# Patient Record
Sex: Female | Born: 2000 | Race: White | Hispanic: No | Marital: Single | Smoking: Never smoker
Health system: Southern US, Community
[De-identification: ages and names within clinical notes are randomized; demographics above are authoritative.]

## PROBLEM LIST (undated history)

## (undated) DIAGNOSIS — J45909 Unspecified asthma, uncomplicated: Secondary | ICD-10-CM

## (undated) DIAGNOSIS — Z91010 Allergy to peanuts: Secondary | ICD-10-CM

## (undated) DIAGNOSIS — F419 Anxiety disorder, unspecified: Secondary | ICD-10-CM

## (undated) DIAGNOSIS — F32A Depression, unspecified: Secondary | ICD-10-CM

## (undated) DIAGNOSIS — D649 Anemia, unspecified: Secondary | ICD-10-CM

## (undated) HISTORY — DX: Unspecified asthma, uncomplicated: J45.909

## (undated) HISTORY — DX: Allergy to peanuts: Z91.010

## (undated) HISTORY — DX: Anxiety disorder, unspecified: F41.9

## (undated) HISTORY — PX: WISDOM TOOTH EXTRACTION: SHX21

## (undated) HISTORY — DX: Anemia, unspecified: D64.9

## (undated) HISTORY — PX: NO PAST SURGERIES: SHX2092

## (undated) HISTORY — DX: Depression, unspecified: F32.A

---
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
## 2021-01-29 ENCOUNTER — Other Ambulatory Visit: Payer: Self-pay | Admitting: Physician Assistant

## 2021-01-29 DIAGNOSIS — Z3041 Encounter for surveillance of contraceptive pills: Secondary | ICD-10-CM

## 2021-01-31 NOTE — Telephone Encounter (Signed)
Please reach out to patient to schedule CPE per last AVS.

## 2021-02-08 ENCOUNTER — Encounter: Payer: Self-pay | Admitting: Surgical

## 2021-02-08 ENCOUNTER — Other Ambulatory Visit: Payer: Self-pay

## 2021-02-08 ENCOUNTER — Ambulatory Visit (INDEPENDENT_AMBULATORY_CARE_PROVIDER_SITE_OTHER): Payer: BC Managed Care – PPO | Admitting: Surgical

## 2021-02-08 VITALS — BP 117/80 | HR 80 | Ht 63.0 in | Wt 198.8 lb

## 2021-02-08 DIAGNOSIS — M546 Pain in thoracic spine: Secondary | ICD-10-CM

## 2021-02-08 DIAGNOSIS — M25512 Pain in left shoulder: Secondary | ICD-10-CM

## 2021-02-08 DIAGNOSIS — F603 Borderline personality disorder: Secondary | ICD-10-CM

## 2021-02-08 DIAGNOSIS — M25511 Pain in right shoulder: Secondary | ICD-10-CM

## 2021-02-08 DIAGNOSIS — G8929 Other chronic pain: Secondary | ICD-10-CM

## 2021-02-08 DIAGNOSIS — N62 Hypertrophy of breast: Secondary | ICD-10-CM

## 2021-02-08 MED ORDER — ONDANSETRON HCL 4 MG PO TABS: 4.0000 mg | ORAL_TABLET | Freq: Three times a day (TID) | ORAL | 0 refills | Status: DC | PRN

## 2021-02-08 MED ORDER — HYDROCODONE-ACETAMINOPHEN 5-325 MG PO TABS: 1.0000 | ORAL_TABLET | Freq: Four times a day (QID) | ORAL | 0 refills | Status: AC | PRN

## 2021-02-08 MED ORDER — CEPHALEXIN 500 MG PO CAPS: 500.0000 mg | ORAL_CAPSULE | Freq: Four times a day (QID) | ORAL | 0 refills | Status: AC

## 2021-02-08 NOTE — Progress Notes (Signed)
Patient ID: Anna Pineda, female    DOB: 03/06/2001, 20 y.o.   MRN: [id number]  Chief Complaint  Patient presents with   Pre-op Exam       ICD-10-CM   1. Borderline personality disorder (HCC)  F60.3     2. Symptomatic mammary hypertrophy  N62     3. Chronic bilateral thoracic back pain  M54.6    G89.29     4. Chronic pain of both shoulders  M25.511    G89.29    M25.512        History of Present Illness: Anna Pineda is a 20 y.o.  female  with a history of macromastia.  She presents for preoperative evaluation for upcoming procedure, Bilateral Breast Reduction, scheduled for 02/24/2021 with Dr.  Ulice Bold  The patient has not had problems with anesthesia. No history of DVT/PE.  No family history of DVT/PE.  No family or personal history of bleeding or clotting disorders.  Patient is not currently taking any blood thinners.  No history of CVA/MI.   Summary of Previous Visit: Patient would like to be a C/D cup.  STN on the right is 32 cm and the left is 32 cm.  Estimated excess breast tissue to be removed at time of surgery: 550 on the left and 550 on the right.  PMH Significant for: Anxiety, depression, mild asthma   Past Medical History: Allergies: Allergies  Allergen Reactions   Peanut-Containing Drug Products Hives   Latex Rash    Current Medications:  Current Outpatient Medications:    cetirizine (ZYRTEC ALLERGY) 10 MG tablet, Take 1 tablet (10 mg total) by mouth daily., Disp: 30 tablet, Rfl: 0   hydrOXYzine (ATARAX/VISTARIL) 25 MG tablet, Take 1 tablet (25 mg total) by mouth at bedtime as needed (for sleeping difficulties.)., Disp: 30 tablet, Rfl: 1   ibuprofen (ADVIL,MOTRIN) 200 MG tablet, Take 200 mg by mouth every 6 (six) hours as needed for headache. , Disp: , Rfl:    montelukast (SINGULAIR) 10 MG tablet, Take 10 mg by mouth at bedtime., Disp: , Rfl:    naproxen (NAPROSYN) 375 MG tablet, Take 1 tablet (375 mg total) by mouth 2 (two) times daily with a  meal., Disp: 30 tablet, Rfl: 0   pseudoephedrine (SUDAFED) 30 MG tablet, Take 1 tablet (30 mg total) by mouth every 8 (eight) hours as needed for congestion., Disp: 30 tablet, Rfl: 0   SPRINTEC 28 0.25-35 MG-MCG tablet, TAKE 1 TABLET BY MOUTH EVERY DAY, Disp: 28 tablet, Rfl: 2   EPINEPHrine 0.3 mg/0.3 mL IJ SOAJ injection, , Disp: , Rfl:    fluticasone (FLONASE) 50 MCG/ACT nasal spray, Place 2 sprays into both nostrils daily., Disp: 16 g, Rfl: 0   PROAIR HFA 108 (90 BASE) MCG/ACT inhaler, Inhale 1 puff into the lungs as needed. (Patient not taking: Reported on 02/08/2021), Disp: , Rfl: 12  Past Medical Problems: Past Medical History:  Diagnosis Date   Anemia    Phreesia 02/04/2020   Anxiety    Phreesia 02/04/2020   Asthma    Phreesia 02/04/2020   Depression    Phreesia 02/04/2020   Depression, recurrent (HCC) 07/08/2019   Food allergy, peanut    GAD (generalized anxiety disorder) 07/08/2019   Mild asthma     Past Surgical History: Past Surgical History:  Procedure Laterality Date   NO PAST SURGERIES      Social History: Social History   Socioeconomic History   Marital status: Single  Spouse name: Not on file   Number of children: Not on file   Years of education: Not on file   Highest education level: Not on file  Occupational History   Occupation: waitress    Employer: [employer]  Tobacco Use   Smoking status: Passive Smoke Exposure - Never Smoker   Smokeless tobacco: Never  Substance and Sexual Activity   Alcohol use: Not Currently   Drug use: Yes    Types: Marijuana   Sexual activity: Yes    Birth control/protection: Pill  Other Topics Concern   Not on file  Social History Narrative   Not on file   Social Determinants of Health   Financial Resource Strain: Not on file  Food Insecurity: Not on file  Transportation Needs: Not on file  Physical Activity: Not on file  Stress: Not on file  Social Connections: Not on file  Intimate Partner Violence: Not  on file    Family History: Family History  Problem Relation Age of Onset   Hypertension Mother    Breast cancer Mother 62   Depression Father    Diabetes Father    Hyperlipidemia Father    Hypertension Father     Review of Systems: Review of Systems  Constitutional: Negative.   Respiratory: Negative.    Cardiovascular: Negative.   Gastrointestinal: Negative.   Neurological: Negative.    Physical Exam: Vital Signs BP 117/80 (BP Location: Right Arm, Patient Position: Sitting, Cuff Size: Large)   Pulse 80   Ht 5\' 3"  (1.6 m)   Wt 198 lb 12.8 oz (90.2 kg)   SpO2 98%   BMI 35.22 kg/m   Physical Exam  Constitutional:      General: Not in acute distress.    Appearance: Normal appearance. Not ill-appearing.  HENT:     Head: Normocephalic and atraumatic.  Eyes:     Pupils: Pupils are equal, round Neck:     Musculoskeletal: Normal range of motion.  Cardiovascular:     Rate and Rhythm: Normal rate    Pulses: Normal pulses.  Pulmonary:     Effort: Pulmonary effort is normal. No respiratory distress.  Abdominal:     General: Abdomen is flat. There is no distension.  Musculoskeletal: Normal range of motion.  Skin:    General: Skin is warm and dry.     Findings: No erythema or rash.  Neurological:     General: No focal deficit present.     Mental Status: Alert and oriented to person, place, and time. Mental status is at baseline.     Motor: No weakness.  Psychiatric:        Mood and Affect: Mood normal.        Behavior: Behavior normal.    Assessment/Plan: The patient is scheduled for bilateral breast reduction with Dr. .  Risks, benefits, and alternatives of procedure discussed, questions answered and consent obtained.    Smoking Status: Quit smoking 2 months ago; Counseling Given?  Yes Last Mammogram: No mammographic history  Caprini Score: 4, moderate; Risk Factors include: Currently on OCPs, BMI greater than 25, and length of planned surgery.  Recommendation for mechanical and pharmacological prophylaxis while hospitalized. Encourage early ambulation.   Pictures obtained:[email or web address]   Post-op Rx sent to pharmacy: Norco, Zofran, Keflex  Patient was provided with the breast reduction and General Surgical Risk consent document and Pain Medication Agreement prior to their appointment.  They had adequate time to read through the risk consent documents and Pain Medication Agreement. We  also discussed them in person together during this preop appointment. All of their questions were answered to their satisfaction.  Recommended calling if they have any further questions.  Risk consent form and Pain Medication Agreement to be scanned into patient's chart.  The risk that can be encountered with breast reduction were discussed and include the following but not limited to these:  Breast asymmetry, fluid accumulation, firmness of the breast, inability to breast feed, loss of nipple or areola, skin loss, decrease or no nipple sensation, fat necrosis of the breast tissue, bleeding, infection, healing delay.  There are risks of anesthesia, changes to skin sensation and injury to nerves or blood vessels.  The muscle can be temporarily or permanently injured.  You may have an allergic reaction to tape, suture, glue, blood products which can result in skin discoloration, swelling, pain, skin lesions, poor healing.  Any of these can lead to the need for revisonal surgery or stage procedures.  A reduction has potential to interfere with diagnostic procedures.  Nipple or breast piercing can increase risks of infection.  This procedure is best done when the breast is fully developed.  Changes in the breast will continue to occur over time.  Pregnancy can alter the outcomes of previous breast reduction surgery, weight gain and weigh loss can also effect the long term appearance.   Patient is aware she will need to remove nipple piercings prior to surgery to decrease risk  of postoperative complications including infection.  Electronically signed by: Kermit Balo Camauri Craton, PA-C 02/08/2021 9:53 AM

## 2021-02-08 NOTE — H&P (View-Only) (Signed)
Patient ID: Anna Pineda, female    DOB: 03/06/2001, 20 y.o.   MRN: [id number]  Chief Complaint  Patient presents with   Pre-op Exam       ICD-10-CM   1. Borderline personality disorder (HCC)  F60.3     2. Symptomatic mammary hypertrophy  N62     3. Chronic bilateral thoracic back pain  M54.6    G89.29     4. Chronic pain of both shoulders  M25.511    G89.29    M25.512        History of Present Illness: Anna Pineda is a 20 y.o.  female  with a history of macromastia.  She presents for preoperative evaluation for upcoming procedure, Bilateral Breast Reduction, scheduled for 02/24/2021 with Dr.  Ulice Bold  The patient has not had problems with anesthesia. No history of DVT/PE.  No family history of DVT/PE.  No family or personal history of bleeding or clotting disorders.  Patient is not currently taking any blood thinners.  No history of CVA/MI.   Summary of Previous Visit: Patient would like to be a C/D cup.  STN on the right is 32 cm and the left is 32 cm.  Estimated excess breast tissue to be removed at time of surgery: 550 on the left and 550 on the right.  PMH Significant for: Anxiety, depression, mild asthma   Past Medical History: Allergies: Allergies  Allergen Reactions   Peanut-Containing Drug Products Hives   Latex Rash    Current Medications:  Current Outpatient Medications:    cetirizine (ZYRTEC ALLERGY) 10 MG tablet, Take 1 tablet (10 mg total) by mouth daily., Disp: 30 tablet, Rfl: 0   hydrOXYzine (ATARAX/VISTARIL) 25 MG tablet, Take 1 tablet (25 mg total) by mouth at bedtime as needed (for sleeping difficulties.)., Disp: 30 tablet, Rfl: 1   ibuprofen (ADVIL,MOTRIN) 200 MG tablet, Take 200 mg by mouth every 6 (six) hours as needed for headache. , Disp: , Rfl:    montelukast (SINGULAIR) 10 MG tablet, Take 10 mg by mouth at bedtime., Disp: , Rfl:    naproxen (NAPROSYN) 375 MG tablet, Take 1 tablet (375 mg total) by mouth 2 (two) times daily with a  meal., Disp: 30 tablet, Rfl: 0   pseudoephedrine (SUDAFED) 30 MG tablet, Take 1 tablet (30 mg total) by mouth every 8 (eight) hours as needed for congestion., Disp: 30 tablet, Rfl: 0   SPRINTEC 28 0.25-35 MG-MCG tablet, TAKE 1 TABLET BY MOUTH EVERY DAY, Disp: 28 tablet, Rfl: 2   EPINEPHrine 0.3 mg/0.3 mL IJ SOAJ injection, , Disp: , Rfl:    fluticasone (FLONASE) 50 MCG/ACT nasal spray, Place 2 sprays into both nostrils daily., Disp: 16 g, Rfl: 0   PROAIR HFA 108 (90 BASE) MCG/ACT inhaler, Inhale 1 puff into the lungs as needed. (Patient not taking: Reported on 02/08/2021), Disp: , Rfl: 12  Past Medical Problems: Past Medical History:  Diagnosis Date   Anemia    Phreesia 02/04/2020   Anxiety    Phreesia 02/04/2020   Asthma    Phreesia 02/04/2020   Depression    Phreesia 02/04/2020   Depression, recurrent (HCC) 07/08/2019   Food allergy, peanut    GAD (generalized anxiety disorder) 07/08/2019   Mild asthma     Past Surgical History: Past Surgical History:  Procedure Laterality Date   NO PAST SURGERIES      Social History: Social History   Socioeconomic History   Marital status: Single  Spouse name: Not on file   Number of children: Not on file   Years of education: Not on file   Highest education level: Not on file  Occupational History   Occupation: waitress    Employer: [employer]  Tobacco Use   Smoking status: Passive Smoke Exposure - Never Smoker   Smokeless tobacco: Never  Substance and Sexual Activity   Alcohol use: Not Currently   Drug use: Yes    Types: Marijuana   Sexual activity: Yes    Birth control/protection: Pill  Other Topics Concern   Not on file  Social History Narrative   Not on file   Social Determinants of Health   Financial Resource Strain: Not on file  Food Insecurity: Not on file  Transportation Needs: Not on file  Physical Activity: Not on file  Stress: Not on file  Social Connections: Not on file  Intimate Partner Violence: Not  on file    Family History: Family History  Problem Relation Age of Onset   Hypertension Mother    Breast cancer Mother 62   Depression Father    Diabetes Father    Hyperlipidemia Father    Hypertension Father     Review of Systems: Review of Systems  Constitutional: Negative.   Respiratory: Negative.    Cardiovascular: Negative.   Gastrointestinal: Negative.   Neurological: Negative.    Physical Exam: Vital Signs BP 117/80 (BP Location: Right Arm, Patient Position: Sitting, Cuff Size: Large)   Pulse 80   Ht 5\' 3"  (1.6 m)   Wt 198 lb 12.8 oz (90.2 kg)   SpO2 98%   BMI 35.22 kg/m   Physical Exam  Constitutional:      General: Not in acute distress.    Appearance: Normal appearance. Not ill-appearing.  HENT:     Head: Normocephalic and atraumatic.  Eyes:     Pupils: Pupils are equal, round Neck:     Musculoskeletal: Normal range of motion.  Cardiovascular:     Rate and Rhythm: Normal rate    Pulses: Normal pulses.  Pulmonary:     Effort: Pulmonary effort is normal. No respiratory distress.  Abdominal:     General: Abdomen is flat. There is no distension.  Musculoskeletal: Normal range of motion.  Skin:    General: Skin is warm and dry.     Findings: No erythema or rash.  Neurological:     General: No focal deficit present.     Mental Status: Alert and oriented to person, place, and time. Mental status is at baseline.     Motor: No weakness.  Psychiatric:        Mood and Affect: Mood normal.        Behavior: Behavior normal.    Assessment/Plan: The patient is scheduled for bilateral breast reduction with Dr. .  Risks, benefits, and alternatives of procedure discussed, questions answered and consent obtained.    Smoking Status: Quit smoking 2 months ago; Counseling Given?  Yes Last Mammogram: No mammographic history  Caprini Score: 4, moderate; Risk Factors include: Currently on OCPs, BMI greater than 25, and length of planned surgery.  Recommendation for mechanical and pharmacological prophylaxis while hospitalized. Encourage early ambulation.   Pictures obtained:@[email or web address]   Post-op Rx sent to pharmacy: Norco, Zofran, Keflex  Patient was provided with the breast reduction and General Surgical Risk consent document and Pain Medication Agreement prior to their appointment.  They had adequate time to read through the risk consent documents and Pain Medication Agreement. We  also discussed them in person together during this preop appointment. All of their questions were answered to their satisfaction.  Recommended calling if they have any further questions.  Risk consent form and Pain Medication Agreement to be scanned into patient's chart.  The risk that can be encountered with breast reduction were discussed and include the following but not limited to these:  Breast asymmetry, fluid accumulation, firmness of the breast, inability to breast feed, loss of nipple or areola, skin loss, decrease or no nipple sensation, fat necrosis of the breast tissue, bleeding, infection, healing delay.  There are risks of anesthesia, changes to skin sensation and injury to nerves or blood vessels.  The muscle can be temporarily or permanently injured.  You may have an allergic reaction to tape, suture, glue, blood products which can result in skin discoloration, swelling, pain, skin lesions, poor healing.  Any of these can lead to the need for revisonal surgery or stage procedures.  A reduction has potential to interfere with diagnostic procedures.  Nipple or breast piercing can increase risks of infection.  This procedure is best done when the breast is fully developed.  Changes in the breast will continue to occur over time.  Pregnancy can alter the outcomes of previous breast reduction surgery, weight gain and weigh loss can also effect the long term appearance.   Patient is aware she will need to remove nipple piercings prior to surgery to decrease risk  of postoperative complications including infection.  Electronically signed by: Kermit Balo Britanie Harshman, PA-C 02/08/2021 9:53 AM

## 2021-02-17 ENCOUNTER — Encounter (HOSPITAL_BASED_OUTPATIENT_CLINIC_OR_DEPARTMENT_OTHER): Payer: Self-pay | Admitting: Plastic Surgery

## 2021-02-17 ENCOUNTER — Other Ambulatory Visit: Payer: Self-pay

## 2021-02-24 ENCOUNTER — Ambulatory Visit (HOSPITAL_BASED_OUTPATIENT_CLINIC_OR_DEPARTMENT_OTHER): Payer: BC Managed Care – PPO | Admitting: Anesthesiology

## 2021-02-24 ENCOUNTER — Ambulatory Visit (HOSPITAL_BASED_OUTPATIENT_CLINIC_OR_DEPARTMENT_OTHER)
Admission: RE | Admit: 2021-02-24 | Discharge: 2021-02-24 | Disposition: A | Discharge status: Home / Self Care | Payer: BC Managed Care – PPO | Attending: Plastic Surgery | Admitting: Plastic Surgery

## 2021-02-24 ENCOUNTER — Encounter (HOSPITAL_BASED_OUTPATIENT_CLINIC_OR_DEPARTMENT_OTHER): Payer: Self-pay | Admitting: Plastic Surgery

## 2021-02-24 ENCOUNTER — Other Ambulatory Visit: Payer: Self-pay

## 2021-02-24 ENCOUNTER — Encounter (HOSPITAL_BASED_OUTPATIENT_CLINIC_OR_DEPARTMENT_OTHER)
Admission: RE | Disposition: A | Discharge status: Home / Self Care | Payer: Self-pay | Source: Home / Self Care | Attending: Plastic Surgery

## 2021-02-24 DIAGNOSIS — N6489 Other specified disorders of breast: Secondary | ICD-10-CM | POA: Insufficient documentation

## 2021-02-24 DIAGNOSIS — Z91010 Allergy to peanuts: Secondary | ICD-10-CM | POA: Insufficient documentation

## 2021-02-24 DIAGNOSIS — G8929 Other chronic pain: Secondary | ICD-10-CM | POA: Diagnosis not present

## 2021-02-24 DIAGNOSIS — N62 Hypertrophy of breast: Secondary | ICD-10-CM | POA: Insufficient documentation

## 2021-02-24 DIAGNOSIS — M542 Cervicalgia: Secondary | ICD-10-CM | POA: Diagnosis not present

## 2021-02-24 DIAGNOSIS — Z79899 Other long term (current) drug therapy: Secondary | ICD-10-CM | POA: Insufficient documentation

## 2021-02-24 DIAGNOSIS — M546 Pain in thoracic spine: Secondary | ICD-10-CM | POA: Diagnosis not present

## 2021-02-24 DIAGNOSIS — Z91040 Latex allergy status: Secondary | ICD-10-CM | POA: Insufficient documentation

## 2021-02-24 HISTORY — PX: BREAST REDUCTION SURGERY: SHX8

## 2021-02-24 LAB — POCT PREGNANCY, URINE: Preg Test, Ur: NEGATIVE

## 2021-02-24 SURGERY — BREAST REDUCTION WITH LIPOSUCTION
Anesthesia: General | Site: Breast | Laterality: Bilateral

## 2021-02-24 MED ORDER — FENTANYL CITRATE (PF) 100 MCG/2ML IJ SOLN
INTRAMUSCULAR | Status: AC
  Filled 2021-02-24: qty 2

## 2021-02-24 MED ORDER — CEFAZOLIN SODIUM-DEXTROSE 2-4 GM/100ML-% IV SOLN
2.0000 g | INTRAVENOUS | Status: AC
  Administered 2021-02-24: 2 g via INTRAVENOUS

## 2021-02-24 MED ORDER — CEFAZOLIN SODIUM-DEXTROSE 2-4 GM/100ML-% IV SOLN
INTRAVENOUS | Status: AC
  Filled 2021-02-24: qty 100

## 2021-02-24 MED ORDER — MIDAZOLAM HCL 2 MG/2ML IJ SOLN
INTRAMUSCULAR | Status: AC
  Filled 2021-02-24: qty 2

## 2021-02-24 MED ORDER — PROPOFOL 10 MG/ML IV BOLUS
INTRAVENOUS | Status: DC | PRN
  Administered 2021-02-24: 200 mg via INTRAVENOUS

## 2021-02-24 MED ORDER — FENTANYL CITRATE (PF) 100 MCG/2ML IJ SOLN: 25.0000 ug | INTRAMUSCULAR | Status: DC | PRN

## 2021-02-24 MED ORDER — ACETAMINOPHEN 325 MG PO TABS: 650.0000 mg | ORAL_TABLET | ORAL | Status: DC | PRN

## 2021-02-24 MED ORDER — ONDANSETRON HCL 4 MG/2ML IJ SOLN
INTRAMUSCULAR | Status: DC | PRN
  Administered 2021-02-24: 4 mg via INTRAVENOUS

## 2021-02-24 MED ORDER — SCOPOLAMINE 1 MG/3DAYS TD PT72
MEDICATED_PATCH | TRANSDERMAL | Status: AC
  Filled 2021-02-24: qty 1

## 2021-02-24 MED ORDER — LACTATED RINGERS IV SOLN: INTRAVENOUS | Status: DC

## 2021-02-24 MED ORDER — PROMETHAZINE HCL 25 MG/ML IJ SOLN: 6.2500 mg | INTRAMUSCULAR | Status: DC | PRN

## 2021-02-24 MED ORDER — SODIUM CHLORIDE 0.9% FLUSH: 3.0000 mL | INTRAVENOUS | Status: DC | PRN

## 2021-02-24 MED ORDER — CHLORHEXIDINE GLUCONATE CLOTH 2 % EX PADS: 6.0000 | MEDICATED_PAD | Freq: Once | CUTANEOUS | Status: DC

## 2021-02-24 MED ORDER — LIDOCAINE HCL 1 % IJ SOLN
INTRAVENOUS | Status: DC | PRN
  Administered 2021-02-24: 800 mL

## 2021-02-24 MED ORDER — SUGAMMADEX SODIUM 200 MG/2ML IV SOLN
INTRAVENOUS | Status: DC | PRN
  Administered 2021-02-24: 200 mg via INTRAVENOUS

## 2021-02-24 MED ORDER — ACETAMINOPHEN 325 MG RE SUPP: 650.0000 mg | RECTAL | Status: DC | PRN

## 2021-02-24 MED ORDER — OXYCODONE HCL 5 MG PO TABS: 5.0000 mg | ORAL_TABLET | Freq: Once | ORAL | Status: DC | PRN

## 2021-02-24 MED ORDER — FENTANYL CITRATE (PF) 100 MCG/2ML IJ SOLN
INTRAMUSCULAR | Status: DC | PRN
  Administered 2021-02-24: 100 ug via INTRAVENOUS
  Administered 2021-02-24 (×4): 50 ug via INTRAVENOUS

## 2021-02-24 MED ORDER — SODIUM CHLORIDE 0.9 % IV SOLN: 250.0000 mL | INTRAVENOUS | Status: DC | PRN

## 2021-02-24 MED ORDER — FENTANYL CITRATE (PF) 100 MCG/2ML IJ SOLN
25.0000 ug | INTRAMUSCULAR | Status: DC | PRN
  Administered 2021-02-24: 25 ug via INTRAVENOUS

## 2021-02-24 MED ORDER — DEXAMETHASONE SODIUM PHOSPHATE 4 MG/ML IJ SOLN
INTRAMUSCULAR | Status: DC | PRN
  Administered 2021-02-24: 10 mg via INTRAVENOUS

## 2021-02-24 MED ORDER — SODIUM CHLORIDE 0.9% FLUSH: 3.0000 mL | Freq: Two times a day (BID) | INTRAVENOUS | Status: DC

## 2021-02-24 MED ORDER — ACETAMINOPHEN 500 MG PO TABS
ORAL_TABLET | ORAL | Status: AC
  Filled 2021-02-24: qty 2

## 2021-02-24 MED ORDER — PROPOFOL 10 MG/ML IV BOLUS
INTRAVENOUS | Status: AC
  Filled 2021-02-24: qty 20

## 2021-02-24 MED ORDER — LIDOCAINE-EPINEPHRINE 1 %-1:100000 IJ SOLN
INTRAMUSCULAR | Status: DC | PRN
  Administered 2021-02-24: 48 mL

## 2021-02-24 MED ORDER — LIDOCAINE HCL (CARDIAC) PF 100 MG/5ML IV SOSY
PREFILLED_SYRINGE | INTRAVENOUS | Status: DC | PRN
  Administered 2021-02-24: 100 mg via INTRAVENOUS

## 2021-02-24 MED ORDER — ACETAMINOPHEN 500 MG PO TABS
1000.0000 mg | ORAL_TABLET | Freq: Once | ORAL | Status: AC
  Administered 2021-02-24: 1000 mg via ORAL

## 2021-02-24 MED ORDER — ROCURONIUM BROMIDE 100 MG/10ML IV SOLN
INTRAVENOUS | Status: DC | PRN
  Administered 2021-02-24: 60 mg via INTRAVENOUS

## 2021-02-24 MED ORDER — MIDAZOLAM HCL 5 MG/5ML IJ SOLN
INTRAMUSCULAR | Status: DC | PRN
  Administered 2021-02-24: 2 mg via INTRAVENOUS

## 2021-02-24 MED ORDER — OXYCODONE HCL 5 MG/5ML PO SOLN: 5.0000 mg | Freq: Once | ORAL | Status: DC | PRN

## 2021-02-24 MED ORDER — SCOPOLAMINE 1 MG/3DAYS TD PT72
1.0000 | MEDICATED_PATCH | Freq: Once | TRANSDERMAL | Status: DC
  Administered 2021-02-24: 1.5 mg via TRANSDERMAL

## 2021-02-24 MED ORDER — OXYCODONE HCL 5 MG PO TABS
5.0000 mg | ORAL_TABLET | ORAL | Status: DC | PRN
Start: 2021-02-24 — End: 2021-02-24

## 2021-02-24 SURGICAL SUPPLY — 62 items
ADH SKN CLS APL DERMABOND .7 (GAUZE/BANDAGES/DRESSINGS) ×2
BAG DECANTER FOR FLEXI CONT (MISCELLANEOUS) IMPLANT
BINDER BREAST LRG (GAUZE/BANDAGES/DRESSINGS) IMPLANT
BINDER BREAST MEDIUM (GAUZE/BANDAGES/DRESSINGS) IMPLANT
BINDER BREAST XLRG (GAUZE/BANDAGES/DRESSINGS) ×2 IMPLANT
BINDER BREAST XXLRG (GAUZE/BANDAGES/DRESSINGS) IMPLANT
BIOPATCH RED 1 DISK 7.0 (GAUZE/BANDAGES/DRESSINGS) IMPLANT
BLADE HEX COATED 2.75 (ELECTRODE) ×2 IMPLANT
BLADE KNIFE PERSONA 10 (BLADE) ×4 IMPLANT
BLADE SURG 15 STRL LF DISP TIS (BLADE) IMPLANT
BLADE SURG 15 STRL SS (BLADE)
CANISTER SUCT 1200ML W/VALVE (MISCELLANEOUS) ×2 IMPLANT
COVER BACK TABLE 60X90IN (DRAPES) ×2 IMPLANT
COVER MAYO STAND STRL (DRAPES) ×2 IMPLANT
DECANTER SPIKE VIAL GLASS SM (MISCELLANEOUS) IMPLANT
DERMABOND ADVANCED (GAUZE/BANDAGES/DRESSINGS) ×2
DERMABOND ADVANCED .7 DNX12 (GAUZE/BANDAGES/DRESSINGS) ×2 IMPLANT
DRAIN CHANNEL 19F RND (DRAIN) IMPLANT
DRAPE LAPAROSCOPIC ABDOMINAL (DRAPES) ×2 IMPLANT
DRSG OPSITE POSTOP 4X6 (GAUZE/BANDAGES/DRESSINGS) ×4 IMPLANT
DRSG PAD ABDOMINAL 8X10 ST (GAUZE/BANDAGES/DRESSINGS) ×8 IMPLANT
ELECT BLADE 4.0 EZ CLEAN MEGAD (MISCELLANEOUS)
ELECT REM PT RETURN 9FT ADLT (ELECTROSURGICAL) ×2
ELECTRODE BLDE 4.0 EZ CLN MEGD (MISCELLANEOUS) IMPLANT
ELECTRODE REM PT RTRN 9FT ADLT (ELECTROSURGICAL) ×1 IMPLANT
EVACUATOR SILICONE 100CC (DRAIN) IMPLANT
GLOVE SURG ENC MOIS LTX SZ6.5 (GLOVE) IMPLANT
GLOVE SURG ENC MOIS LTX SZ7.5 (GLOVE) IMPLANT
GLOVE SURG UNDER POLY LF SZ6.5 (GLOVE) ×6 IMPLANT
GLOVE SURG UNDER POLY LF SZ7.5 (GLOVE) ×2 IMPLANT
GOWN STRL REUS W/ TWL LRG LVL3 (GOWN DISPOSABLE) ×5 IMPLANT
GOWN STRL REUS W/TWL LRG LVL3 (GOWN DISPOSABLE) ×10
NEEDLE FILTER BLUNT 18X 1/2SAF (NEEDLE) ×1
NEEDLE FILTER BLUNT 18X1 1/2 (NEEDLE) ×1 IMPLANT
NEEDLE HYPO 25X1 1.5 SAFETY (NEEDLE) ×2 IMPLANT
NS IRRIG 1000ML POUR BTL (IV SOLUTION) ×2 IMPLANT
PACK BASIN DAY SURGERY FS (CUSTOM PROCEDURE TRAY) ×2 IMPLANT
PAD ALCOHOL SWAB (MISCELLANEOUS) IMPLANT
PAD FOAM SILICONE BACKED (GAUZE/BANDAGES/DRESSINGS) ×2 IMPLANT
PENCIL SMOKE EVACUATOR (MISCELLANEOUS) ×2 IMPLANT
PIN SAFETY STERILE (MISCELLANEOUS) IMPLANT
SLEEVE SCD COMPRESS KNEE MED (STOCKING) ×2 IMPLANT
SPONGE T-LAP 18X18 ~~LOC~~+RFID (SPONGE) ×6 IMPLANT
STRIP SUTURE WOUND CLOSURE 1/2 (MISCELLANEOUS) ×4 IMPLANT
SUT MNCRL AB 4-0 PS2 18 (SUTURE) ×8 IMPLANT
SUT MON AB 3-0 SH 27 (SUTURE) ×12
SUT MON AB 3-0 SH27 (SUTURE) ×6 IMPLANT
SUT MON AB 5-0 PS2 18 (SUTURE) ×4 IMPLANT
SUT PDS 3-0 CT2 (SUTURE) ×8
SUT PDS II 3-0 CT2 27 ABS (SUTURE) ×4 IMPLANT
SUT SILK 3 0 PS 1 (SUTURE) IMPLANT
SYR 50ML LL SCALE MARK (SYRINGE) ×2 IMPLANT
SYR BULB IRRIG 60ML STRL (SYRINGE) ×2 IMPLANT
SYR CONTROL 10ML LL (SYRINGE) ×2 IMPLANT
TAPE MEASURE VINYL STERILE (MISCELLANEOUS) IMPLANT
TOWEL GREEN STERILE FF (TOWEL DISPOSABLE) ×4 IMPLANT
TRAY DSU PREP LF (CUSTOM PROCEDURE TRAY) ×2 IMPLANT
TUBE CONNECTING 20X1/4 (TUBING) ×2 IMPLANT
TUBING INFILTRATION IT-10001 (TUBING) ×2 IMPLANT
TUBING SET GRADUATE ASPIR 12FT (MISCELLANEOUS) ×2 IMPLANT
UNDERPAD 30X36 HEAVY ABSORB (UNDERPADS AND DIAPERS) ×4 IMPLANT
YANKAUER SUCT BULB TIP NO VENT (SUCTIONS) ×2 IMPLANT

## 2021-02-24 NOTE — Op Note (Signed)
Breast Reduction Op note:    DATE OF PROCEDURE: 02/24/2021  LOCATION: [facility]  SURGEON: Alan Ripper Sanger Alix Stowers, DO  ASSISTANT: Keenan Bachelor, PA  PREOPERATIVE DIAGNOSIS 1. Macromastia 2. Neck Pain 3. Back Pain  POSTOPERATIVE DIAGNOSIS 1. Macromastia 2. Neck Pain 3. Back Pain  PROCEDURES 1. Bilateral breast reduction.  Right reduction 690 g, Left reduction 675 g  COMPLICATIONS: None.  DRAINS: none  INDICATIONS FOR PROCEDURE Anna Pineda is a 20 y.o. year-old female born on [birth year],with a history of symptomatic macromastia with concominant back pain, neck pain, shoulder grooving from her bra.   MRN: [id number]  CONSENT Informed consent was obtained directly from the patient. The risks, benefits and alternatives were fully discussed. Specific risks including but not limited to bleeding, infection, hematoma, seroma, scarring, pain, nipple necrosis, asymmetry, poor cosmetic results, and need for further surgery were discussed. The patient had ample opportunity to have her questions answered to her satisfaction.  DESCRIPTION OF PROCEDURE  Patient was brought into the operating room and placed in a supine position.  SCDs were placed and appropriate padding was performed.  Antibiotics were given. The patient underwent general anesthesia and the chest was prepped and draped in a sterile fashion.  A timeout was performed and all information was confirmed to be correct. Tumescent was placed at the lateral portion of both breasts.  Right side: Preoperative markings were confirmed.  Incision lines were injected with local with epinephrine.  After waiting for vasoconstriction, the marked lines were incised.  A Wise-pattern superomedial breast reduction was performed by de-epithelializing the pedicle, using bovie to create the superomedial pedicle, and removing breast tissue from the superior, lateral, and inferior portions of the breast.  Care was taken to not  undermine the breast pedicle. Hemostasis was achieved.  The nipple was gently rotated into position and the soft tissue closed with 4-0 Monocryl.   Liposuction was done on the lateral aspect. The pocket was irrigated and hemostasis confirmed.  The deep tissues were approximated with 3-0 PDS and Monocryl sutures and the skin was closed with deep dermal and subcuticular 4-0 Monocryl sutures.  The nipple and skin flaps had good capillary refill at the end of the procedure.    Left side: Preoperative markings were confirmed.  Incision lines were injected with local with epinephrine.  After waiting for vasoconstriction, the marked lines were incised.  A Wise-pattern superomedial breast reduction was performed by de-epithelializing the pedicle, using bovie to create the superomedial pedicle, and removing breast tissue from the superior, lateral, and inferior portions of the breast.  Care was taken to not undermine the breast pedicle. Hemostasis was achieved.  The nipple was gently rotated into position and the soft tissue was closed with 4-0 Monocryl.  Liposuction was done at the lateral portion of the breast. The patient was sat upright and size and shape symmetry was confirmed.  The pocket was irrigated and hemostasis confirmed.  The deep tissues were approximated with 3-0 PDS and Monocryl sutures and the skin was closed with deep dermal and subcuticular 4-0 Monocryl sutures.  Dermabond was applied.  A breast binder and ABDs were placed.  The nipple and skin flaps had good capillary refill at the end of the procedure.  The patient tolerated the procedure well. The patient was allowed to wake from anesthesia and taken to the recovery room in satisfactory condition.  The advanced practice practitioner (APP) assisted throughout the case.  The APP was essential in retraction and counter traction when  needed to make the case progress smoothly.  This retraction and assistance made it possible to see the tissue plans for  the procedure.  The assistance was needed for blood control, tissue re-approximation and assisted with closure of the incision site.

## 2021-02-24 NOTE — Anesthesia Preprocedure Evaluation (Addendum)
Anesthesia Evaluation  Patient identified by MRN, date of birth, ID band Patient awake    Reviewed: Allergy & Precautions, NPO status , Patient's Chart, lab work & pertinent test results  History of Anesthesia Complications Negative for: history of anesthetic complications  Airway Mallampati: II  TM Distance: >3 FB Neck ROM: Full    Dental no notable dental hx.    Pulmonary asthma ,    Pulmonary exam normal        Cardiovascular negative cardio ROS Normal cardiovascular exam     Neuro/Psych Anxiety Depression negative neurological ROS     GI/Hepatic negative GI ROS, Neg liver ROS,   Endo/Other  negative endocrine ROS  Renal/GU negative Renal ROS  negative genitourinary   Musculoskeletal negative musculoskeletal ROS (+)   Abdominal   Peds  Hematology negative hematology ROS (+)   Anesthesia Other Findings Day of surgery medications reviewed with patient.  Reproductive/Obstetrics negative OB ROS                            Anesthesia Physical Anesthesia Plan  ASA: 2  Anesthesia Plan: General   Post-op Pain Management:    Induction: Intravenous  PONV Risk Score and Plan: 4 or greater and Treatment may vary due to age or medical condition, Ondansetron, Dexamethasone, Midazolam and Scopolamine patch - Pre-op  Airway Management Planned: Oral ETT  Additional Equipment: None  Intra-op Plan:   Post-operative Plan: Extubation in OR  Informed Consent: I have reviewed the patients History and Physical, chart, labs and discussed the procedure including the risks, benefits and alternatives for the proposed anesthesia with the patient or authorized representative who has indicated his/her understanding and acceptance.     Dental advisory given  Plan Discussed with: CRNA  Anesthesia Plan Comments:        Anesthesia Quick Evaluation

## 2021-02-24 NOTE — Interval H&P Note (Signed)
History and Physical Interval Note:  02/24/2021 11:13 AM  Anna Pineda  has presented today for surgery, with the diagnosis of mammary hypertrophy.  The various methods of treatment have been discussed with the patient and family. After consideration of risks, benefits and other options for treatment, the patient has consented to  Procedure(s) with comments: BREAST REDUCTION WITH LIPOSUCTION (Bilateral) - 3 hours as a surgical intervention.  The patient's history has been reviewed, patient examined, no change in status, stable for surgery.  I have reviewed the patient's chart and labs.  Questions were answered to the patient's satisfaction.     Alena Bills Jerico Grisso

## 2021-02-24 NOTE — Transfer of Care (Signed)
Immediate Anesthesia Transfer of Care Note  Patient: Anna Pineda  Procedure(s) Performed: BILATERAL BREAST REDUCTION WITH LIPOSUCTION (Bilateral: Breast)  Patient Location: PACU  Anesthesia Type:General  Level of Consciousness: awake, alert , oriented, drowsy and patient cooperative  Airway & Oxygen Therapy: Patient Spontanous Breathing and Patient connected to face mask oxygen  Post-op Assessment: Report given to RN and Post -op Vital signs reviewed and stable  Post vital signs: Reviewed and stable  Last Vitals:  Vitals Value Taken Time  BP    Temp    Pulse 97 02/24/21 1417  Resp    SpO2 98 % 02/24/21 1417  Vitals shown include unvalidated device data.  Last Pain:  Vitals:   02/24/21 1049  TempSrc: Oral  PainSc: 0-No pain      Patients Stated Pain Goal: 4 (02/24/21 1049)  Complications: No notable events documented.

## 2021-02-24 NOTE — Anesthesia Procedure Notes (Signed)

## 2021-02-24 NOTE — Discharge Instructions (Addendum)
INSTRUCTIONS FOR AFTER SURGERY   You will likely have some questions about what to expect following your operation.  The following information will help you and your family understand what to expect when you are discharged from the hospital.  Following these guidelines will help ensure a smooth recovery and reduce risks of complications.  Postoperative instructions include information on: diet, wound care, medications and physical activity.  AFTER SURGERY Expect to go home after the procedure.  In some cases, you may need to spend one night in the hospital for observation.  DIET This surgery does not require a specific diet.  However, I have to mention that the healthier you eat the better your body can start healing. It is important to increasing your protein intake.  This means limiting the foods with added sugar.  Focus on fruits and vegetables and some meat. It is very important to drink water after your surgery.  If your urine is bright yellow, then it is concentrated, and you need to drink more water.  As a general rule after surgery, you should have 8 ounces of water every hour while awake.  If you find you are persistently nauseated or unable to take in liquids let us know.  NO TOBACCO USE or EXPOSURE.  This will slow your healing process and increase the risk of a wound.  WOUND CARE If you don't have a drain: You can shower the day after surgery.  Use fragrance free soap.  Dial, Dove, Ivory and Cetaphil are usually mild on the skin.  If you have a drain: Clean with baby wipes for 3-5 days and then you can shower.  If you have a binder you may remove it to shower and then put it back on. If you have steri-strips / tape directly attached to your skin leave them in place. It is OK to get these wet.  No baths, pools or hot tubs for two weeks. We close your incision to leave the smallest and best-looking scar. No ointment or creams on your incisions until given the go ahead.  Especially not  Neosporin (Too many skin reactions with this one).  A few weeks after surgery you can use Mederma and start massaging the scar. We ask you to wear your binder or sports bra for the first 6 weeks around the clock, including while sleeping. This provides added comfort and helps reduce the fluid accumulation at the surgery site.  ACTIVITY No heavy lifting until cleared by the doctor.  It is OK to walk and climb stairs. In fact, moving your legs is very important to decrease your risk of a blood clot.  It will also help keep you from getting deconditioned.  Every 1 to 2 hours get up and walk for 5 minutes. This will help with a quicker recovery back to normal.  Let pain be your guide so you don't do too much.  NO, you cannot do the spring cleaning and don't plan on taking care of anyone else.  This is your time for TLC.   WORK Everyone returns to work at different times. As a rough guide, most people take at least 1 - 2 weeks off prior to returning to work. If you need documentation for your job, bring the forms to your postoperative follow up visit.  DRIVING Arrange for someone to bring you home from the hospital.  You may be able to drive a few days after surgery but not while taking any narcotics or valium.  BOWEL   MOVEMENTS Constipation can occur after anesthesia and while taking pain medication.  It is important to stay ahead for your comfort.  We recommend taking Milk of Magnesia (2 tablespoons; twice a day) while taking the pain pills.  SEROMA This is fluid your body tried to put in the surgical site.  This is normal but if it creates excessive pain and swelling let us know.  It usually decreases in a few weeks.  MEDICATIONS and PAIN CONTROL At your preoperative visit for you history and physical you were given the following medications: An antibiotic: Start this medication when you get home and take according to the instructions on the bottle. Zofran 4 mg:  This is to treat nausea and  vomiting.  You can take this every 6 hours as needed and only if needed. Norco (hydrocodone/acetaminophen) 5/325 mg:  This is only to be used after you have taken the motrin or the tylenol. Every 8 hours as needed. Over the counter Medication to take: Ibuprofen (Motrin) 600 mg:  Take this every 6 hours.  If you have additional pain then take 500 mg of the tylenol.  Only take the Norco after you have tried these two. Miralax or stool softener of choice: Take this according to the bottle if you take the Norco.  WHEN TO CALL Call your surgeon's office if any of the following occur:  Fever 101 degrees F or greater  Excessive bleeding or fluid from the incision site.  Pain that increases over time without aid from the medications  Redness, warmth, or pus draining from incision sites  Persistent nausea or inability to take in liquids  Severe misshapen area that underwent the operation.  May take Tylenol after 5pm, if needed.    Post Anesthesia Home Care Instructions  Activity: Get plenty of rest for the remainder of the day. A responsible individual must stay with you for 24 hours following the procedure.  For the next 24 hours, DO NOT: -Drive a car -Advertising copywriter -Drink alcoholic beverages -Take any medication unless instructed by your physician -Make any legal decisions or sign important papers.  Meals: Start with liquid foods such as gelatin or soup. Progress to regular foods as tolerated. Avoid greasy, spicy, heavy foods. If nausea and/or vomiting occur, drink only clear liquids until the nausea and/or vomiting subsides. Call your physician if vomiting continues.  Special Instructions/Symptoms: Your throat may feel dry or sore from the anesthesia or the breathing tube placed in your throat during surgery. If this causes discomfort, gargle with warm salt water. The discomfort should disappear within 24 hours.  If you had a scopolamine patch placed behind your ear for the  management of post- operative nausea and/or vomiting:  1. The medication in the patch is effective for 72 hours, after which it should be removed.  Wrap patch in a tissue and discard in the trash. Wash hands thoroughly with soap and water. 2. You may remove the patch earlier than 72 hours if you experience unpleasant side effects which may include dry mouth, dizziness or visual disturbances. 3. Avoid touching the patch. Wash your hands with soap and water after contact with the patch.

## 2021-02-25 NOTE — Anesthesia Postprocedure Evaluation (Signed)
Anesthesia Post Note  Patient: Anna Pineda  Procedure(s) Performed: BILATERAL BREAST REDUCTION WITH LIPOSUCTION (Bilateral: Breast)     Patient location during evaluation: PACU Anesthesia Type: General Level of consciousness: awake and alert and oriented Pain management: pain level controlled Vital Signs Assessment: post-procedure vital signs reviewed and stable Respiratory status: spontaneous breathing, nonlabored ventilation and respiratory function stable Cardiovascular status: blood pressure returned to baseline Postop Assessment: no apparent nausea or vomiting Anesthetic complications: no   No notable events documented.  Last Vitals:  Vitals:   02/24/21 1445 02/24/21 1521  BP: 135/77 131/78  Pulse: 79 91  Resp: 14 16  Temp:  36.7 C  SpO2: 97% 95%    Last Pain:  Vitals:   02/24/21 1521  TempSrc:   PainSc: 2                  Kaylyn Layer

## 2021-02-28 LAB — SURGICAL PATHOLOGY

## 2021-03-01 ENCOUNTER — Telehealth: Payer: Self-pay | Admitting: Plastic Surgery

## 2021-03-01 NOTE — Telephone Encounter (Signed)
Returned patients call. She has been wearing the foam pads on the lateral sides of breast since surgery and has been causing some discomfort. Advised the bruising and tenderness is from the liposuction. Per [place], she is okay to remove pads. Continue using binder 24/7 unless taking a shower.  Patient understood and agreed with plan.

## 2021-03-01 NOTE — Telephone Encounter (Signed)
Patient reports experiencing bruising from her side pads and is inquiring about when they can be removed. Sx 7/14. Please call to advise [phone]. Thanks.

## 2021-03-04 ENCOUNTER — Encounter: Payer: Self-pay | Admitting: Plastic Surgery

## 2021-03-04 ENCOUNTER — Other Ambulatory Visit: Payer: Self-pay

## 2021-03-04 ENCOUNTER — Ambulatory Visit (INDEPENDENT_AMBULATORY_CARE_PROVIDER_SITE_OTHER): Payer: BC Managed Care – PPO | Admitting: Plastic Surgery

## 2021-03-04 DIAGNOSIS — G8929 Other chronic pain: Secondary | ICD-10-CM

## 2021-03-04 DIAGNOSIS — N62 Hypertrophy of breast: Secondary | ICD-10-CM

## 2021-03-04 DIAGNOSIS — M25511 Pain in right shoulder: Secondary | ICD-10-CM

## 2021-03-04 DIAGNOSIS — M25512 Pain in left shoulder: Secondary | ICD-10-CM

## 2021-03-04 NOTE — Progress Notes (Signed)
The patient is a 20 year old female here for follow-up after undergoing bilateral breast reduction.  She is very happy with her results.  No sign of hematoma.  Steri-Strips were placed.  She had over 600 g removed from each breast last week.  Her pain is well controlled and she is moving her bowels.  She can go into a sports bra and can shower.  I like to see her back in 2 weeks.

## 2021-03-18 ENCOUNTER — Ambulatory Visit (INDEPENDENT_AMBULATORY_CARE_PROVIDER_SITE_OTHER): Payer: BC Managed Care – PPO | Admitting: Surgical

## 2021-03-18 ENCOUNTER — Other Ambulatory Visit: Payer: Self-pay

## 2021-03-18 DIAGNOSIS — N62 Hypertrophy of breast: Secondary | ICD-10-CM

## 2021-03-18 NOTE — Progress Notes (Signed)
20 year old female here for follow-up after bilateral breast reduction on 02/24/2021.  She is doing really well.  She reports some mild pain to the left lateral breast but otherwise is very happy and pleased with how things are healing.   Chaperone present on exam On exam bilateral NAC's are viable, bilateral breast incisions are intact.  No erythema or cellulitic changes are noted.  No subcutaneous fluid collections are noted.  No wounds noted.  Some slight swelling is still present.   Recommend continue her compressive garment 24/7 for total of 6 weeks after surgery. Recommend continue to avoid strenuous activities for a total of 3 more weeks. Recommend calling with questions or concerns.  We discussed scheduling an additional follow-up or following up as needed, patient is comfortable following up on an as-needed basis. Steri-Strips and suture knots were removed today.  Patient tolerated this fine. No sign of infection, seroma, hematoma.  Pictures were obtained of the patient and placed in the chart with the patient's or guardian's permission.

## 2021-04-20 ENCOUNTER — Other Ambulatory Visit: Payer: Self-pay | Admitting: Physician Assistant

## 2021-04-20 DIAGNOSIS — Z3041 Encounter for surveillance of contraceptive pills: Secondary | ICD-10-CM

## 2021-10-14 ENCOUNTER — Ambulatory Visit: Payer: BC Managed Care – PPO | Admitting: Adult Health

## 2021-11-07 ENCOUNTER — Ambulatory Visit: Payer: BC Managed Care – PPO | Admitting: Adult Health

## 2021-11-23 ENCOUNTER — Emergency Department (HOSPITAL_COMMUNITY)
Admission: EM | Admit: 2021-11-23 | Discharge: 2021-11-24 | Disposition: A | Discharge status: Home / Self Care | Payer: BC Managed Care – PPO | Attending: Emergency Medicine | Admitting: Emergency Medicine

## 2021-11-23 ENCOUNTER — Other Ambulatory Visit: Payer: Self-pay

## 2021-11-23 ENCOUNTER — Encounter (HOSPITAL_COMMUNITY): Payer: Self-pay

## 2021-11-23 DIAGNOSIS — J45909 Unspecified asthma, uncomplicated: Secondary | ICD-10-CM | POA: Diagnosis not present

## 2021-11-23 DIAGNOSIS — S0990XA Unspecified injury of head, initial encounter: Secondary | ICD-10-CM | POA: Diagnosis present

## 2021-11-23 DIAGNOSIS — W500XXA Accidental hit or strike by another person, initial encounter: Secondary | ICD-10-CM | POA: Insufficient documentation

## 2021-11-23 DIAGNOSIS — Y9289 Other specified places as the place of occurrence of the external cause: Secondary | ICD-10-CM | POA: Insufficient documentation

## 2021-11-23 DIAGNOSIS — W19XXXA Unspecified fall, initial encounter: Secondary | ICD-10-CM

## 2021-11-23 DIAGNOSIS — M545 Low back pain, unspecified: Secondary | ICD-10-CM | POA: Diagnosis not present
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
## 2021-11-24 ENCOUNTER — Emergency Department (HOSPITAL_COMMUNITY): Payer: BC Managed Care – PPO

## 2021-11-24 LAB — PREGNANCY, URINE: Preg Test, Ur: NEGATIVE

## 2021-11-24 MED ORDER — NAPROXEN 500 MG PO TABS
500.0000 mg | ORAL_TABLET | Freq: Once | ORAL | Status: AC
  Administered 2021-11-24: 500 mg via ORAL
  Filled 2021-11-24: qty 1

## 2021-11-24 MED ORDER — NAPROXEN 500 MG PO TABS: 500.0000 mg | ORAL_TABLET | Freq: Two times a day (BID) | ORAL | 0 refills | Status: DC | PRN

## 2021-11-24 NOTE — Discharge Instructions (Addendum)
You were seen in the emergency department today following a head injury.  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  Your  CT scan did not show any new abnormality such as a brain bleed.  We would like you to follow-up with the [facility], contact information below: ? ?Address: [address], [place], [place] [address] ?Phone: [phone] ? ?Per [facility] Website:  ? ?What to Expect: Evaluations at the Concussion Clinic ?All patients at the Concussion Clinic are given an extensive three-part evaluation that includes: ?a computerized test to measure memory, visual processing speed, and reaction time ?a test that measures the systems that integrate movement, balance, and vision ?an in-depth review of a detailed symptoms checklist for signs of concussion ?The evaluation process is critical for the treatment and recovery of a concussed patient as no two concussions are alike. Thankfully, the diagnostic tools that trained professionals use can help to better manage head injuries.  ?Part of the technology the doctors and staff at [facility] use in their assessments is a computerized examination called ImPACT. This tool uses six tasks to measure memory, visual processing speed, and reaction time. By analyzing the results of the examination and comparing them to average responses or a baseline score for a patient, our staff can make an informed judgment about the patient?s cognitive functions. ?In addition to the ImPACT examination, patients are given a Vestibular Ocular Motor Screening test. This is a simple and painless test that focuses on the systems that integrate a patient?s movement, balance, and vision.  ?These tests are used in conjunction with a thorough review of a detailed symptoms checklist to complete the patient?s evaluation and develop a treatment plan. ? ?You do not need a referral,  and you can book an appointment online. Our Concussion Hotline is staffed by trained professionals during our regular office hours: Monday - Thursday from 7:30 AM to 4:30 PM, and Fridays from 7:30 AM to 12:00 PM, and the number to call is [phone]. The Concussion Clinic team meets with patients at our [facility] office. Call today. ? ?Additional Imaging:  ?Xrays of your left hip and mid and lower back did not show any fractures.  ? ?Further ED Instructions:  ? ?Please call and follow-up within the concussion clinic as well as your primary care provider within the next 3 to 5 days.  In the meantime we would like you to avoid strenuous/over exertional activities such as sports or running.  Please avoid excess screen time utilizing cell phones, computers, or the TV.  Please avoid activities that require significant amount of concentration.  Please try to rest as much as possible.  We are sending you home with a prescription for naproxen-  this is a nonsteroidal anti-inflammatory medication that will help with pain and swelling. Be sure to take this medication as prescribed with food, 1 pill every 12 hours,  It should be taken with food, as it can cause stomach upset, and more seriously, stomach bleeding. Do not take other nonsteroidal anti-inflammatory medications with this such as Advil, Motrin, Aleve, Mobic, Goodie Powder, or Motrin etc..   ? ?You make take Tylenol per over the counter dosing with these medications.  ? ?We have prescribed you new medication(s) today. Discuss the medications prescribed today with your pharmacist as they can have adverse effects and interactions with your other medicines including over the counter and prescribed medications. Seek  medical evaluation if you start to experience new or abnormal symptoms after taking one of these medicines, seek care immediately if you start to experience difficulty breathing, feeling of your throat closing, facial swelling, or rash as these could be  indications of a more serious allergic reaction ? ?Please follow up with the concussion clinic and or primary care within 1 week. Return to the ER for new or worsening symptoms including but not limited to new worsening pain sudden change in headache, seizure activity, vomiting, numbness, weakness, blood in your vomit or stool, blood in your urine, trouble walking, or any other concerns. ? ? ?
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
## 2021-11-29 ENCOUNTER — Ambulatory Visit (INDEPENDENT_AMBULATORY_CARE_PROVIDER_SITE_OTHER): Payer: BC Managed Care – PPO | Admitting: Adult Health

## 2021-11-29 ENCOUNTER — Encounter: Payer: Self-pay | Admitting: Adult Health

## 2021-11-29 VITALS — BP 125/81 | HR 84 | Ht 63.0 in | Wt 197.6 lb

## 2021-11-29 DIAGNOSIS — F4522 Body dysmorphic disorder: Secondary | ICD-10-CM

## 2021-11-29 DIAGNOSIS — F339 Major depressive disorder, recurrent, unspecified: Secondary | ICD-10-CM | POA: Diagnosis not present

## 2021-11-29 DIAGNOSIS — F411 Generalized anxiety disorder: Secondary | ICD-10-CM | POA: Diagnosis not present

## 2021-11-29 DIAGNOSIS — F429 Obsessive-compulsive disorder, unspecified: Secondary | ICD-10-CM

## 2021-11-29 DIAGNOSIS — F603 Borderline personality disorder: Secondary | ICD-10-CM | POA: Diagnosis not present

## 2021-11-29 MED ORDER — SERTRALINE HCL 50 MG PO TABS: 50.0000 mg | ORAL_TABLET | Freq: Every day | ORAL | 5 refills | Status: DC
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
## 2021-12-26 ENCOUNTER — Ambulatory Visit: Payer: BC Managed Care – PPO | Admitting: Adult Health

## 2022-03-23 ENCOUNTER — Ambulatory Visit (INDEPENDENT_AMBULATORY_CARE_PROVIDER_SITE_OTHER): Payer: BC Managed Care – PPO | Admitting: Adult Health

## 2022-03-23 ENCOUNTER — Encounter: Payer: Self-pay | Admitting: Adult Health

## 2022-03-23 DIAGNOSIS — F429 Obsessive-compulsive disorder, unspecified: Secondary | ICD-10-CM

## 2022-03-23 DIAGNOSIS — F4522 Body dysmorphic disorder: Secondary | ICD-10-CM

## 2022-03-23 DIAGNOSIS — F411 Generalized anxiety disorder: Secondary | ICD-10-CM

## 2022-03-23 DIAGNOSIS — F603 Borderline personality disorder: Secondary | ICD-10-CM | POA: Diagnosis not present

## 2022-03-23 DIAGNOSIS — F339 Major depressive disorder, recurrent, unspecified: Secondary | ICD-10-CM

## 2022-03-23 MED ORDER — SERTRALINE HCL 100 MG PO TABS: 100.0000 mg | ORAL_TABLET | Freq: Every day | ORAL | 5 refills | Status: DC

## 2022-03-23 NOTE — Progress Notes (Signed)
Anna Pineda [id number] [birth year] 21 y.o.  Subjective:   Patient ID:  Anna Pineda is a 21 y.o. (DOB [birth year]) female.  Chief Complaint: No chief complaint on file.   HPI Anna Pineda presents to the office today for follow-up of GAD, MDD, BPD, OCD, Body dysmorphia.  Describes mood today as "better". Pleasant. Tearful at times - ""not as much". Mood symptoms - reports decreased depression - "easier to get out of". Reports anxiety - "better initially". Reports irritability - "periodically". Reports decreased worry and rumination. Reports decreased obsessional thoughts and acts - physical and verbal. Feels like addition of Zoloft at 50mg  has been helpful, but would like to increase to 100mg  daily. Stable interest and motivation. Taking medications as prescribed.  Energy levels stable. Active, has a regular exercise routine. Enjoys some usual interests and activities. Single - dating. Lives with roommate - cat, dog and Lizard. Spending time with family - mother and 65 year old brother live locally.   Appetite adequate. Weight loss 197.6 pounds.   Sleeps well most nights. Averages 6 to 9 hours. Focus and concentration stable. Completing tasks. Managing aspects of household. 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 Works at [employer]. Planning to attend graduate school in [employer] or [employer]. Denies SI or HI.  Denies AH or VH. Denies any recent self harm. Vaping some, occasional alcohol, occasional weed.   Previous medication trials: Minipress, Effexor   GAD-7    Flowsheet Row Office Visit from 02/04/2020 in [facility] Visit from 09/25/2019 in [facility] Visit from 07/08/2019 in [facility]  Total GAD-7 Score 14 17 21       PHQ2-9    Flowsheet Row Office Visit from 02/04/2020 in [facility] Visit from 09/25/2019 in [facility] Visit from 07/08/2019 in [facility] Total Score 2 4 4   PHQ-9 Total Score 11 15 17       Flowsheet Row ED from 11/23/2021 in [facility] Admission (Discharged) from 02/24/2021 in [facility] ED from 10/15/2020 [facility]   C-SSRS RISK CATEGORY No Risk No Risk No Risk        Review of Systems:  Review of Systems  Musculoskeletal:  Negative for gait problem.  Neurological:  Negative for tremors.  Psychiatric/Behavioral:         Please refer to HPI    Medications: I have reviewed the patient's current medications.  Current Outpatient Medications  Medication Sig Dispense Refill   sertraline (ZOLOFT) 50 MG tablet Take 1 tablet (50 mg total) by mouth daily. 30 tablet 5   No current facility-administered medications for this visit.    Medication Side Effects: None  Allergies:  Allergies  Allergen Reactions   Lactase-Lactobacillus Diarrhea and Nausea And Vomiting   Peanut-Containing Drug Products Hives   Latex Rash    Past Medical History:  Diagnosis Date   Anemia    Phreesia 02/04/2020   Anxiety    Phreesia 02/04/2020   Asthma    Phreesia 02/04/2020   Depression    Phreesia 02/04/2020   Depression, recurrent (HCC) 07/08/2019   Food allergy, peanut    GAD (generalized anxiety disorder) 07/08/2019  Mild asthma     Past Medical History, Surgical history, Social history, and Family history were reviewed and updated as appropriate.   Please see review of systems for further details on the patient's review from today.   Objective:   Physical Exam:  There were no vitals taken for this visit.  Physical Exam Constitutional:      General: She is not in acute distress. Musculoskeletal:        General: No deformity.  Neurological:     Mental Status: She is alert and oriented to person,  place, and time.     Coordination: Coordination normal.  Psychiatric:        Attention and Perception: Attention and perception normal. She does not perceive auditory or visual hallucinations.        Mood and Affect: Mood normal. Mood is not anxious or depressed. Affect is not labile, blunt, angry or inappropriate.        Speech: Speech normal.        Behavior: Behavior normal.        Thought Content: Thought content normal. Thought content is not paranoid or delusional. Thought content does not include homicidal or suicidal ideation. Thought content does not include homicidal or suicidal plan.        Cognition and Memory: Cognition and memory normal.        Judgment: Judgment normal.     Comments: Insight intact     Lab Review:     Component Value Date/Time   NA 140 02/04/2020 0950   K 4.2 02/04/2020 0950   CL 105 02/04/2020 0950   CO2 21 02/04/2020 0950   GLUCOSE 92 02/04/2020 0950   GLUCOSE 103 (H) 10/31/2019 1020   BUN 8 02/04/2020 0950   CREATININE 0.57 02/04/2020 0950   CALCIUM 9.1 02/04/2020 0950   PROT 6.1 02/04/2020 0950   ALBUMIN 3.9 02/04/2020 0950   AST 11 02/04/2020 0950   ALT 10 02/04/2020 0950   ALKPHOS 98 02/04/2020 0950   BILITOT 0.2 02/04/2020 0950   GFRNONAA 136 02/04/2020 0950   GFRAA 157 02/04/2020 0950       Component Value Date/Time   WBC 6.3 03/08/2020 0956   WBC 5.8 10/31/2019 1020   RBC 3.88 03/08/2020 0956   RBC 4.12 10/31/2019 1020   HGB 12.1 03/08/2020 0956   HCT 35.8 03/08/2020 0956   PLT 279 03/08/2020 0956   MCV 92 03/08/2020 0956   MCH 31.2 03/08/2020 0956   MCH 30.6 07/20/2014 1138   MCHC 33.8 03/08/2020 0956   MCHC 34.0 10/31/2019 1020   RDW 13.2 03/08/2020 0956   LYMPHSABS 1.7 10/31/2019 1020   MONOABS 0.4 10/31/2019 1020   EOSABS 0.3 10/31/2019 1020   BASOSABS 0.0 10/31/2019 1020    No results found for: "POCLITH", "LITHIUM"   No results found for: "PHENYTOIN", "PHENOBARB", "VALPROATE", "CBMZ"   .res Assessment: Plan:     Plan:  Continue therapy  Increase Zoloft 50mg  to 100mg  daily  Time spent with patient was 25 minutes. Greater than 50% of face to face time with patient was spent on counseling and coordination of care.    RTC 4 weeks  Patient advised to contact office with any questions, adverse effects, or acute worsening in signs and symptoms.  Diagnoses and all orders for this visit:  GAD (generalized anxiety disorder)  Depression, recurrent (HCC)  Borderline personality disorder (HCC)  Obsessive-compulsive disorder, unspecified type  Body dysmorphic disorder     Please see After Visit  Summary for patient specific instructions.  No future appointments.  No orders of the defined types were placed in this encounter.   -------------------------------

## 2022-05-04 ENCOUNTER — Ambulatory Visit (INDEPENDENT_AMBULATORY_CARE_PROVIDER_SITE_OTHER): Payer: Self-pay | Admitting: Adult Health

## 2022-05-04 DIAGNOSIS — F489 Nonpsychotic mental disorder, unspecified: Secondary | ICD-10-CM

## 2022-05-04 NOTE — Progress Notes (Signed)
Patient no show appointment. ? ?

## 2022-05-08 ENCOUNTER — Ambulatory Visit (INDEPENDENT_AMBULATORY_CARE_PROVIDER_SITE_OTHER): Payer: BC Managed Care – PPO

## 2022-05-08 ENCOUNTER — Ambulatory Visit
Admission: EM | Admit: 2022-05-08 | Discharge: 2022-05-08 | Disposition: A | Discharge status: Home / Self Care | Payer: BC Managed Care – PPO | Attending: Urgent Care | Admitting: Urgent Care

## 2022-05-08 DIAGNOSIS — S9001XA Contusion of right ankle, initial encounter: Secondary | ICD-10-CM | POA: Diagnosis not present

## 2022-05-08 DIAGNOSIS — M25471 Effusion, right ankle: Secondary | ICD-10-CM

## 2022-05-08 DIAGNOSIS — M25571 Pain in right ankle and joints of right foot: Secondary | ICD-10-CM | POA: Diagnosis not present

## 2022-05-08 MED ORDER — NAPROXEN 500 MG PO TABS: 500.0000 mg | ORAL_TABLET | Freq: Two times a day (BID) | ORAL | 0 refills | Status: AC

## 2022-05-08 NOTE — ED Triage Notes (Signed)
The pt c/o right ankle pain after hitting her foot on the curb last night while skateboarding. The patient states this morning she was not able to bare weight to the extremity.  Home interventions: ice and elevation

## 2022-05-08 NOTE — ED Provider Notes (Signed)
[facility]  Note:  This document was prepared using Systems analyst and may include unintentional dictation errors.  MRN: [id number] DOB: [birth year]  Subjective:   Anna Pineda is a 21 y.o. adult presenting for suffering a right ankle injury last night. She was skateboarding and ended up jumping off the skateboard.  She time getting correctly and made impact with her ankle against a curb.  She since had significant pain, swelling, difficulty moving her ankle at all.  She has used icing and elevation.  No current facility-administered medications for this encounter.  Current Outpatient Medications:    sertraline (ZOLOFT) 100 MG tablet, Take 1 tablet (100 mg total) by mouth daily., Disp: 30 tablet, Rfl: 5   Allergies  Allergen Reactions   Lactase-Lactobacillus Diarrhea and Nausea And Vomiting   Peanut-Containing Drug Products Hives   Latex Rash    Past Medical History:  Diagnosis Date   Anemia    Phreesia 02/04/2020   Anxiety    Phreesia 02/04/2020   Asthma    Phreesia 02/04/2020   Depression    Phreesia 02/04/2020   Depression, recurrent ([place]) 07/08/2019   Food allergy, peanut    GAD (generalized anxiety disorder) 07/08/2019   Mild asthma      Past Surgical History:  Procedure Laterality Date   BREAST REDUCTION SURGERY Bilateral 02/24/2021   Procedure: BILATERAL BREAST REDUCTION WITH LIPOSUCTION;  Surgeon: Wallace Going, DO;  Location: [facility];  Service: Plastics;  Laterality: Bilateral;  3 hours   NO PAST SURGERIES     WISDOM TOOTH EXTRACTION      Family History  Problem Relation Age of Onset   Hypertension Mother    Breast cancer Mother 58   Depression Father    Diabetes Father    Hyperlipidemia Father    Hypertension Father     Social History   Tobacco Use   Smoking status: Never    Passive exposure: Yes   Smokeless tobacco: Never  Vaping Use   Vaping Use: Never used  Substance Use  Topics   Alcohol use: Not Currently   Drug use: Yes    Types: Marijuana    Comment: denies    ROS   Objective:   Vitals: BP 122/84 (BP Location: Right Arm)   Pulse 86   Temp 99.3 F (37.4 C) (Oral)   Resp 18   LMP 05/01/2022 (Approximate)   SpO2 96%   Physical Exam Constitutional:      General: Anna A Pineda "Zo" is not in acute distress.    Appearance: Normal appearance. Anna A Pineda "Zo" is well-developed. Anna A Pineda "Zo" is not ill-appearing, toxic-appearing or diaphoretic.  HENT:     Head: Normocephalic and atraumatic.     Nose: Nose normal.     Mouth/Throat:     Mouth: Mucous membranes are moist.  Eyes:     General: No scleral icterus.       Right eye: No discharge.        Left eye: No discharge.     Extraocular Movements: Extraocular movements intact.  Cardiovascular:     Rate and Rhythm: Normal rate.  Pulmonary:     Effort: Pulmonary effort is normal.  Musculoskeletal:     Right ankle: Swelling present. No deformity, ecchymosis or lacerations. Tenderness present over the lateral malleolus, medial malleolus, ATF ligament and AITF ligament. No CF ligament, posterior TF ligament, base of 5th metatarsal or proximal fibula tenderness. Decreased range  of motion.     Right Achilles Tendon: No tenderness or defects. Thompson's test negative.     Right foot: Decreased range of motion. Normal capillary refill. No swelling, deformity, laceration, tenderness, bony tenderness or crepitus.  Skin:    General: Skin is warm and dry.  Neurological:     General: No focal deficit present.     Mental Status: [name] is alert and oriented to person, place, and time.  Psychiatric:        Mood and Affect: Mood normal.        Behavior: Behavior normal.    DG Ankle Complete Right  Result Date: 05/08/2022 CLINICAL DATA:  21 year old female with right ankle pain EXAM: RIGHT ANKLE - COMPLETE 3+ VIEW COMPARISON:  01/20/2019 FINDINGS: No acute displaced fracture. Mortise  appears congruent. Soft tissue swelling along the low lateral ankle. No radiopaque foreign body. IMPRESSION: Negative for acute bony abnormality. Nonspecific soft tissue swelling at the right lateral ankle Electronically Signed   By: Corrie Mckusick D.O.   On: 05/08/2022 13:18    Right ankle wrapped using 3" Ace wrap in figure-8 method.   Assessment and Plan :   PDMP not reviewed this encounter.  1. Contusion of right ankle, initial encounter   2. Pain and swelling of right ankle     Recommended conservative management for right ankle contusion.  Use RICE method, crutches as needed to ambulate, naproxne for pain and inflammation. Counseled patient on potential for adverse effects with medications prescribed/recommended today, ER and return-to-clinic precautions discussed, patient verbalized understanding.    Jaynee Eagles, [place] 05/08/22 1457

## 2022-05-22 ENCOUNTER — Telehealth: Payer: Self-pay | Admitting: Adult Health

## 2022-05-23 NOTE — Telephone Encounter (Signed)
Please schedule appt

## 2022-06-26 NOTE — Telephone Encounter (Signed)
Pt has appt 11/17.  She would like refill sent in.

## 2022-06-30 ENCOUNTER — Encounter: Payer: Self-pay | Admitting: Adult Health

## 2022-06-30 ENCOUNTER — Ambulatory Visit (INDEPENDENT_AMBULATORY_CARE_PROVIDER_SITE_OTHER): Payer: BC Managed Care – PPO | Admitting: Adult Health

## 2022-06-30 DIAGNOSIS — F429 Obsessive-compulsive disorder, unspecified: Secondary | ICD-10-CM

## 2022-06-30 DIAGNOSIS — F4522 Body dysmorphic disorder: Secondary | ICD-10-CM

## 2022-06-30 DIAGNOSIS — F411 Generalized anxiety disorder: Secondary | ICD-10-CM

## 2022-06-30 DIAGNOSIS — F603 Borderline personality disorder: Secondary | ICD-10-CM

## 2022-06-30 DIAGNOSIS — F339 Major depressive disorder, recurrent, unspecified: Secondary | ICD-10-CM | POA: Diagnosis not present

## 2022-06-30 MED ORDER — SERTRALINE HCL 100 MG PO TABS: 100.0000 mg | ORAL_TABLET | Freq: Every day | ORAL | 5 refills | Status: DC

## 2022-06-30 NOTE — Progress Notes (Signed)
Anna Pineda [id number] 12-09-2000 21 y.o.  Subjective:   Patient ID:  Anna Pineda is a 21 y.o. (DOB [birth year]) adult.  Chief Complaint: No chief complaint on file.   HPI Anna Pineda presents to the office today for follow-up of GAD, MDD, BPD, OCD, Body dysmorphia.  Describes mood today as "ok". Pleasant. Tearful at times - ""not as much". Mood symptoms - reports some depression, anxiety, and irritability - "more normal levels". Reports decreased worry and rumination. Reports decreased obsessional thoughts and acts - physical and verbal. Stating "I'm doing alright". Feels like Zoloft at 100mg  work. Meeting with therapist regularly. Stable interest and motivation. Taking medications as prescribed. Energy levels stable. Active, has a regular exercise routine. Enjoys some usual interests and activities. Single - dating. Lives with roommate - cat, dog and Lizard. Spending time with family - mother and 17 year old brother live locally.   Appetite adequate. Weight loss 195 pounds.   Sleeps well most nights. Averages 6 to 9 hours. Focus and concentration stable. Completing tasks. Managing aspects of household. 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 Works at [employer].  Planning to attend graduate school in [place] or [place]. Denies SI or HI.  Denies AH or VH. Denies any recent self harm. Vaping periodically, occasional alcohol, denies THC use.   Previous medication trials: Minipress, Effexor   GAD-7    Flowsheet Row Office Visit from 02/04/2020 in [facility] Visit from 09/25/2019 in [facility] at [facility] Visit from 07/08/2019 in [facility] at [facility]  Total GAD-7 Score 14 17 21       PHQ2-9    Flowsheet Row Office Visit from 02/04/2020 in [facility] at [facility] Visit from 09/25/2019 in [facility] at [facility] Visit from  07/08/2019 in [facility] at [facility] Total Score 2 4 4   PHQ-9 Total Score 11 15 17       Flowsheet Row ED from 05/08/2022 in [facility] ED from 11/23/2021 in [facility] Admission (Discharged) from 02/24/2021 in [facility]  C-SSRS RISK CATEGORY No Risk No Risk No Risk        Review of Systems:  Review of Systems  Musculoskeletal:  Negative for gait problem.  Neurological:  Negative for tremors.  Psychiatric/Behavioral:         Please refer to HPI    Medications: I have reviewed the patient's current medications.  Current Outpatient Medications  Medication Sig Dispense Refill   naproxen (NAPROSYN) 500 MG tablet Take 1 tablet (500 mg total) by mouth 2 (two) times daily with a meal. 30 tablet 0   sertraline (ZOLOFT) 100 MG tablet Take 1 tablet (100 mg total) by mouth daily. 30 tablet 5   No current facility-administered medications for this visit.    Medication Side Effects: None  Allergies:  Allergies  Allergen Reactions   Lactase-Lactobacillus Diarrhea and Nausea And Vomiting   Peanut-Containing Drug Products Hives   Latex Rash    Past Medical History:  Diagnosis Date   Anemia    Phreesia 02/04/2020   Anxiety    Phreesia 02/04/2020   Asthma    Phreesia 02/04/2020   Depression    Phreesia 02/04/2020   Depression, recurrent (HCC) 07/08/2019   Food allergy, peanut    GAD (generalized anxiety  disorder) 07/08/2019   Mild asthma     Past Medical History, Surgical history, Social history, and Family history were reviewed and updated as appropriate.   Please see review of systems for further details on the patient's review from today.   Objective:   Physical Exam:  There were no vitals taken for this visit.  Physical Exam Constitutional:      General: Anna Pineda "[name]" is not in acute distress. Musculoskeletal:        General: No deformity.  Neurological:     Mental  Status: Anna Pineda "[name]" is alert and oriented to person, place, and time.     Coordination: Coordination normal.  Psychiatric:        Attention and Perception: Attention and perception normal. Anna Pineda "[name]" does not perceive auditory or visual hallucinations.        Mood and Affect: Mood normal. Mood is not anxious or depressed. Affect is not labile, blunt, angry or inappropriate.        Speech: Speech normal.        Behavior: Behavior normal.        Thought Content: Thought content normal. Thought content is not paranoid or delusional. Thought content does not include homicidal or suicidal ideation. Thought content does not include homicidal or suicidal plan.        Cognition and Memory: Cognition and memory normal.        Judgment: Judgment normal.     Comments: Insight intact     Lab Review:     Component Value Date/Time   NA 140 02/04/2020 0950   K 4.2 02/04/2020 0950   CL 105 02/04/2020 0950   CO2 21 02/04/2020 0950   GLUCOSE 92 02/04/2020 0950   GLUCOSE 103 (H) 10/31/2019 1020   BUN 8 02/04/2020 0950   CREATININE 0.57 02/04/2020 0950   CALCIUM 9.1 02/04/2020 0950   PROT 6.1 02/04/2020 0950   ALBUMIN 3.9 02/04/2020 0950   AST 11 02/04/2020 0950   ALT 10 02/04/2020 0950   ALKPHOS 98 02/04/2020 0950   BILITOT 0.2 02/04/2020 0950   GFRNONAA 136 02/04/2020 0950   GFRAA 157 02/04/2020 0950       Component Value Date/Time   WBC 6.3 03/08/2020 0956   WBC 5.8 10/31/2019 1020   RBC 3.88 03/08/2020 0956   RBC 4.12 10/31/2019 1020   HGB 12.1 03/08/2020 0956   HCT 35.8 03/08/2020 0956   PLT 279 03/08/2020 0956   MCV 92 03/08/2020 0956   MCH 31.2 03/08/2020 0956   MCH 30.6 07/20/2014 1138   MCHC 33.8 03/08/2020 0956   MCHC 34.0 10/31/2019 1020   RDW 13.2 03/08/2020 0956   LYMPHSABS 1.7 10/31/2019 1020   MONOABS 0.4 10/31/2019 1020   EOSABS 0.3 10/31/2019 1020   BASOSABS 0.0 10/31/2019 1020    No results found for: "POCLITH", "LITHIUM"   No results found for:  "PHENYTOIN", "PHENOBARB", "VALPROATE", "CBMZ"   .res Assessment: Plan:    Plan:  Continue therapy  Increase Zoloft 50mg  to 100mg  daily  Time spent with patient was 25 minutes. Greater than 50% of face to face time with patient was spent on counseling and coordination of care.    RTC 4 weeks  Patient advised to contact office with any questions, adverse effects, or acute worsening in signs and symptoms. Diagnoses and all orders for this visit:  Depression, recurrent (HCC) -     sertraline (ZOLOFT) 100 MG tablet; Take 1 tablet (100 mg  total) by mouth daily.  GAD (generalized anxiety disorder) -     sertraline (ZOLOFT) 100 MG tablet; Take 1 tablet (100 mg total) by mouth daily.  Obsessive-compulsive disorder, unspecified type -     sertraline (ZOLOFT) 100 MG tablet; Take 1 tablet (100 mg total) by mouth daily.  Body dysmorphic disorder -     sertraline (ZOLOFT) 100 MG tablet; Take 1 tablet (100 mg total) by mouth daily.  Borderline personality disorder (HCC) -     sertraline (ZOLOFT) 100 MG tablet; Take 1 tablet (100 mg total) by mouth daily.     Please see After Visit Summary for patient specific instructions.  No future appointments.   No orders of the defined types were placed in this encounter.   -------------------------------

## 2022-07-02 ENCOUNTER — Encounter (HOSPITAL_COMMUNITY): Payer: Self-pay

## 2022-07-02 ENCOUNTER — Ambulatory Visit (HOSPITAL_COMMUNITY)
Admission: EM | Admit: 2022-07-02 | Discharge: 2022-07-02 | Disposition: A | Discharge status: Home / Self Care | Payer: BC Managed Care – PPO | Attending: Physician Assistant | Admitting: Physician Assistant

## 2022-07-02 DIAGNOSIS — J029 Acute pharyngitis, unspecified: Secondary | ICD-10-CM | POA: Diagnosis not present

## 2022-07-02 LAB — POCT INFECTIOUS MONO SCREEN, ED / UC: Mono Screen: NEGATIVE

## 2022-07-02 LAB — POCT RAPID STREP A, ED / UC: Streptococcus, Group A Screen (Direct): NEGATIVE

## 2022-07-02 MED ORDER — NYSTATIN 100000 UNIT/ML MT SUSP: 5.0000 mL | Freq: Four times a day (QID) | OROMUCOSAL | 0 refills | Status: AC | PRN

## 2022-07-02 NOTE — Discharge Instructions (Signed)
Your strep and monotest were negative.  We are sending your strep for more definitive testing and if it turns out that there is an infection we will contact you to start antibiotic.  I believe your symptoms might be related to yeast irritation from the steroids.  Gargle with Magic mouthwash up to 4 times a day and spit this out.  Make sure to use cool liquids and foods to help with your symptoms.  If your symptoms are not improving please follow-up with ENT; call to schedule an appointment.  If anything worsens and you have swelling of your throat, shortness of breath, difficulty swallowing, difficulty speaking, fever you need to go to the emergency room.

## 2022-07-02 NOTE — ED Provider Notes (Signed)
[facility]    CSN: [id number] Arrival date & time: 07/02/22  1250      History   Chief Complaint Chief Complaint  Patient presents with   Sore Throat    Repeated tonsillitis over the past 2.5 years, recently recovered from respiratory issues/ illness but sore throat came on suddenly and there is visible swelling - Entered by patient    HPI Anna Pineda is a 21 y.o. adult.   Patient presents today with a several day history of sore throat.  Reports this is worse on the right side and present with swallowing.  She was sick last week and was seen by a different medical facility on 2 separate occasions.  At that point she tested negative for flu, COVID, strep.  She was treated with cough medication as well as prednisone and 2 inhalers (albuterol and steroid inhaler).  Those symptoms have improved and she reports only mild ongoing congestion.  Denies any significant cough, chest pain, shortness of breath.  She does have a history of recurrent tonsillitis and wonders if she could have triggered to that with her recent illness.  Denies any recent antibiotics.  She has been using over-the-counter medication without improvement of symptoms.  Denies any dysphagia, swelling of her throat, muffled voice, shortness of breath.    Past Medical History:  Diagnosis Date   Anemia    Phreesia 02/04/2020   Anxiety    Phreesia 02/04/2020   Asthma    Phreesia 02/04/2020   Depression    Phreesia 02/04/2020   Depression, recurrent (HCC) 07/08/2019   Food allergy, peanut    GAD (generalized anxiety disorder) 07/08/2019   Mild asthma     Patient Active Problem List   Diagnosis Date Noted   Symptomatic mammary hypertrophy 05/25/2020   Back pain 05/25/2020   Shoulder pain 05/25/2020   Hx of cow's milk protein sensitivity- however can eat dairy at times with no symptoms 09/25/2019   Diarrhea 09/25/2019   Non-intractable vomiting 09/25/2019   Nausea- chronic 09/25/2019   GAD  (generalized anxiety disorder) 07/08/2019   Depression, recurrent (HCC) 07/08/2019   Borderline personality disorder (HCC) 07/08/2019   Obsessive-compulsive disorder 07/08/2019   Family history of anorexia nervosa-asymptomatic since 20 years old 07/08/2019   Body dysmorphic disorder 07/08/2019   History of iron deficiency anemia 07/08/2019   Family history of breast cancer in first degree relative-patient's mom around 17 years old 07/08/2019   Family history of diabetes mellitus in father 07/08/2019   Family history of hyperlipidemia 07/08/2019   Family history of obesity 07/08/2019   Family history of hypertension 07/08/2019    Past Surgical History:  Procedure Laterality Date   BREAST REDUCTION SURGERY Bilateral 02/24/2021   Procedure: BILATERAL BREAST REDUCTION WITH LIPOSUCTION;  Surgeon: Peggye Form, DO;  Location: [facility];  Service: Plastics;  Laterality: Bilateral;  3 hours   NO PAST SURGERIES     WISDOM TOOTH EXTRACTION      OB History   No obstetric history on file.      Home Medications    Prior to Admission medications   Medication Sig Start Date End Date Taking? Authorizing Provider  magic mouthwash (nystatin, lidocaine, diphenhydrAMINE) suspension Take 5 mLs by mouth 4 (four) times daily as needed for mouth pain. Swish and spit 07/02/22  Yes Trevel Dillenbeck K, PA-C  naproxen (NAPROSYN) 500 MG tablet Take 1 tablet (500 mg total) by mouth 2 (two) times daily with a meal. 05/08/22  Wallis BambergMani, Mario, PA-C  sertraline (ZOLOFT) 100 MG tablet Take 1 tablet (100 mg total) by mouth daily. 06/30/22   Mozingo, Thereasa Soloegina Nattalie, NP  pantoprazole (PROTONIX) 40 MG tablet Take 1 tablet (40 mg total) by mouth daily. 10/31/19 02/04/20  Tressia DanasBeavers, Kimberly, MD  prazosin (MINIPRESS) 1 MG capsule TAKE 1 CAPSULE (1 MG TOTAL) BY MOUTH AT BEDTIME. 10/14/19 02/04/20  Darcel SmallingUmrania, Hiren M, MD  venlafaxine XR (EFFEXOR-XR) 37.5 MG 24 hr capsule TAKE 1 CAPSULE BY MOUTH DAILY WITH  BREAKFAST. 10/14/19 01/05/20  Darcel SmallingUmrania, Hiren M, MD    Family History Family History  Problem Relation Age of Onset   Hypertension Mother    Breast cancer Mother [id number]   Depression Father    Diabetes Father    Hyperlipidemia Father    Hypertension Father     Social History Social History   Tobacco Use   Smoking status: Never    Passive exposure: Yes   Smokeless tobacco: Never  Vaping Use   Vaping Use: Never used  Substance Use Topics   Alcohol use: Not Currently   Drug use: Yes    Types: Marijuana    Comment: denies     Allergies   Lactase-lactobacillus, Peanut-containing drug products, and Latex   Review of Systems Review of Systems  Constitutional:  Positive for activity change. Negative for appetite change, fatigue and fever.  HENT:  Positive for sore throat, trouble swallowing and voice change. Negative for congestion (Improved), sinus pressure and sneezing.   Respiratory:  Negative for cough (Improved) and shortness of breath.   Cardiovascular:  Negative for chest pain.  Gastrointestinal:  Negative for abdominal pain, diarrhea, nausea and vomiting.     Physical Exam Triage Vital Signs ED Triage Vitals  Enc Vitals Group     BP 07/02/22 1353 (!) 133/92     Pulse Rate 07/02/22 1353 81     Resp 07/02/22 1401 18     Temp 07/02/22 1353 98.3 F (36.8 C)     Temp Source 07/02/22 1353 Oral     SpO2 07/02/22 1353 98 %     Weight --      Height --      Head Circumference --      Peak Flow --      Pain Score --      Pain Loc --      Pain Edu? --      Excl. in GC? --    No data found.  Updated Vital Signs BP (!) 133/92 (BP Location: Left Arm)   Pulse 81   Temp 98.3 F (36.8 C) (Oral)   Resp 18   SpO2 98%   Visual Acuity Right Eye Distance:   Left Eye Distance:   Bilateral Distance:    Right Eye Near:   Left Eye Near:    Bilateral Near:     Physical Exam Vitals reviewed.  Constitutional:      General: Anna Pineda "[name]" is awake. Anna Pineda  "[name]" is not in acute distress.    Appearance: Normal appearance. Anna A Pineda "[name]" is well-developed. Anna A Pineda "[name]" is not ill-appearing.     Comments: Very pleasant patient appears stated age in no acute distress sitting comfortably in exam room  HENT:     Head: Normocephalic and atraumatic.     Right Ear: External ear normal.     Left Ear: External ear normal.     Mouth/Throat:     Pharynx: Uvula midline. Posterior oropharyngeal  erythema present. No oropharyngeal exudate.     Tonsils: No tonsillar exudate or tonsillar abscesses. 1+ on the right. 1+ on the left.  Cardiovascular:     Rate and Rhythm: Normal rate and regular rhythm.     Heart sounds: Normal heart sounds, S1 normal and S2 normal. No murmur heard. Pulmonary:     Effort: Pulmonary effort is normal.     Breath sounds: Normal breath sounds. No wheezing, rhonchi or rales.     Comments: Clear to auscultation bilaterally Lymphadenopathy:     Head:     Right side of head: No submental, submandibular or tonsillar adenopathy.     Left side of head: No submental, submandibular or tonsillar adenopathy.  Psychiatric:        Behavior: Behavior is cooperative.      UC Treatments / Results  Labs (all labs ordered are listed, but only abnormal results are displayed) Labs Reviewed  CULTURE, GROUP A STREP [facility])  POCT RAPID STREP A, ED / UC  POCT INFECTIOUS MONO SCREEN, ED / UC    EKG   Radiology No results found.  Procedures Procedures (including critical care time)  Medications Ordered in UC Medications - No data to display  Initial Impression / Assessment and Plan / UC Course  I have reviewed the triage vital signs and the nursing notes.  Pertinent labs & imaging results that were available during my care of the patient were reviewed by me and considered in my medical decision making (see chart for details).     Center score of 1; rapid strep negative in clinic today.  Mono testing was negative as well.   No significant exudate or evidence of peritonsillar abscess on exam.  Discussed that there is no indication to start antibiotics based on physical exam today.  I suspect that her sore throat may be related to developing thrush from inhaled and systemic steroids.  We will start Magic mouthwash for symptomatic treatment.  She was instructed to swish and spit this 4 times a day.  We will send strep testing for culture and if this is positive contact her to start antibiotics.  Given her recurrent symptoms recommend she follow-up with ENT.  She was given contact information for local provider with instruction to call to schedule an appointment.  If she has any worsening symptoms including dysphagia, odynophagia, fever, nausea, vomiting, swelling of her throat, muffled voice she is to go to the emergency room immediately.  Strict return precautions given.  Work excuse note provided.  Final Clinical Impressions(s) / UC Diagnoses   Final diagnoses:  Acute pharyngitis, unspecified etiology     Discharge Instructions      Your strep and monotest were negative.  We are sending your strep for more definitive testing and if it turns out that there is an infection we will contact you to start antibiotic.  I believe your symptoms might be related to yeast irritation from the steroids.  Gargle with Magic mouthwash up to 4 times a day and spit this out.  Make sure to use cool liquids and foods to help with your symptoms.  If your symptoms are not improving please follow-up with ENT; call to schedule an appointment.  If anything worsens and you have swelling of your throat, shortness of breath, difficulty swallowing, difficulty speaking, fever you need to go to the emergency room.     ED Prescriptions     Medication Sig Dispense Auth. Provider   magic mouthwash (nystatin, lidocaine, diphenhydrAMINE)  suspension Take 5 mLs by mouth 4 (four) times daily as needed for mouth pain. Swish and spit 180 mL Ayannah Faddis K,  PA-C      PDMP not reviewed this encounter.   Jeani Hawking, PA-C 07/02/22 1457

## 2022-07-02 NOTE — ED Triage Notes (Signed)
Pt c/o sore throat and red patches on the back of her throat. Pt reports she is hoarse. Pt was seen and prescribe several medication on 06/21/2022. Pt reports these have not help.

## 2022-07-04 LAB — CULTURE, GROUP A STREP (THRC)

## 2022-07-19 ENCOUNTER — Other Ambulatory Visit: Payer: Self-pay | Admitting: Adult Health

## 2022-09-29 ENCOUNTER — Encounter: Payer: Self-pay | Admitting: Adult Health

## 2022-09-29 ENCOUNTER — Ambulatory Visit (INDEPENDENT_AMBULATORY_CARE_PROVIDER_SITE_OTHER): Payer: BC Managed Care – PPO | Admitting: Adult Health

## 2022-09-29 DIAGNOSIS — F429 Obsessive-compulsive disorder, unspecified: Secondary | ICD-10-CM

## 2022-09-29 DIAGNOSIS — F339 Major depressive disorder, recurrent, unspecified: Secondary | ICD-10-CM | POA: Diagnosis not present

## 2022-09-29 DIAGNOSIS — F603 Borderline personality disorder: Secondary | ICD-10-CM

## 2022-09-29 DIAGNOSIS — F411 Generalized anxiety disorder: Secondary | ICD-10-CM

## 2022-09-29 DIAGNOSIS — F4522 Body dysmorphic disorder: Secondary | ICD-10-CM | POA: Diagnosis not present

## 2022-09-29 MED ORDER — SERTRALINE HCL 100 MG PO TABS: 100.0000 mg | ORAL_TABLET | Freq: Every day | ORAL | 3 refills | Status: DC

## 2022-09-29 NOTE — Progress Notes (Signed)
Anna Pineda [id number] [birth year] 22 y.o.  Subjective:   Patient ID:  Anna Pineda is a 22 y.o. (DOB [birth year]) adult.  Chief Complaint: No chief complaint on file.   HPI [name] presents to the office today for follow-up of GAD, MDD, BPD, OCD, Body dysmorphia.  Describes mood today as "ok". Pleasant. Tearful at times - ""not as much". Mood symptoms - reports increased anxiety. Reports decreased depression and irritability. Reports increased worry, over thinking and rumination. Reports decreased obsessional thoughts and acts - physical and verbal. Mood is variable - "pent up tension". Stating "I'm not doing as good as I was". Feels like Zoloft at 168m has been helpful, but would like to increase dose with recent mood decline. Meeting with therapist regularly. Stable interest and motivation. Taking medications as prescribed. Energy levels "really spotty". Active, has a regular exercise routine. Enjoys some usual interests and activities. Single - dating. Lives with roommate - cat, dog and Lizard. Spending time with family - mother and 142year old brother live locally.   Appetite adequate. Weight loss 195 pounds.   Sleeps well most nights. Averages 6 to 9 hours. Focus and concentration stable. Completing tasks. Managing aspects of household. [place] [employer] [place] [employer]. [place] Den[place] SI or HI.  Denies AH or VH. Denies any recent self harm. Vaping periodically, occasional alcohol, denies THC use.   Previous medication trials: Minipress, Effexor    GAD-7    Flowsheet Row [facility] [place] Total GAD-7 Score 14 17 21      $ [place] [facility]/[place] [facility] [place] [place] [facility] [place] Score 2 4 4  $ PHQ-9 Total Score 11 15 17      $ Flowsheet Row [facility] from 02/24/2021 in [place] No Risk No Risk        Review of Systems:  Review of Systems  Musculoskeletal:  Negative for gait problem.  Neurological:  Negative for tremors.  Psychiatric/Behavioral:         Please refer to HPI    Medications: I have reviewed the patient's current medications.  Current Outpatient Medications  Medication Sig Dispense Refill   magic mouthwash (nystatin, lidocaine, diphenhydrAMINE) suspension Take 5 mLs by mouth 4 (four) times daily as needed for mouth pain. Swish and spit 180 mL 0   naproxen (NAPROSYN) 500 MG tablet Take 1 tablet (500 mg total) by mouth 2 (two) times daily with a meal. 30 tablet 0   sertraline (ZOLOFT) 100 MG tablet Take 1 tablet (100 mg total) by mouth daily. 135 tablet 3   No current facility-administered medications for this visit.    Medication Side Effects: None  Allergies:  Allergies  Allergen Reactions   Lactase-Lactobacillus Diarrhea and Nausea And Vomiting   Peanut-Containing Drug Products Hives   Latex Rash    Past Medical  History:  Diagnosis Date   Anemia    Phreesia 02/04/2020   Anxiety    Phreesia 02/04/2020   Asthma    Phreesia 02/04/2020   Depression    Phreesia 02/04/2020   Depression, recurrent ([place]) 07/08/2019   Food allergy, peanut    GAD (generalized anxiety disorder) 07/08/2019   Mild asthma     Past Medical History, Surgical history, Social history, and Family history were reviewed and updated as appropriate.   Please see review of systems for  further details on the patient's review from today.   Objective:   Physical Exam:  There were no vitals taken for this visit.  Physical Exam Constitutional:      General: [name] "Zo" is not in acute distress. Musculoskeletal:        General: No deformity.  Neurological:     Mental Status: Anna Pineda "Zo" is alert and oriented to person, place, and time.     Coordination: Coordination normal.  Psychiatric:        Attention and Perception: Attention and perception normal. Anna Pineda "Zo" does not perceive auditory or visual hallucinations.        Mood and Affect: Mood normal. Mood is not anxious or depressed. Affect is not labile, blunt, angry or inappropriate.        Speech: Speech normal.        Behavior: Behavior normal.        Thought Content: Thought content normal. Thought content is not paranoid or delusional. Thought content does not include homicidal or suicidal ideation. Thought content does not include homicidal or suicidal plan.        Cognition and Memory: Cognition and memory normal.        Judgment: Judgment normal.     Comments: Insight intact     Lab Review:     Component Value Date/Time   NA 140 02/04/2020 0950   K 4.2 02/04/2020 0950   CL 105 02/04/2020 0950   CO2 21 02/04/2020 0950   GLUCOSE 92 02/04/2020 0950   GLUCOSE 103 (H) 10/31/2019 1020   BUN 8 02/04/2020 0950   CREATININE 0.57 02/04/2020 0950   CALCIUM 9.1 02/04/2020 0950   PROT 6.1 02/04/2020 0950   ALBUMIN 3.9 02/04/2020 0950   AST 11 02/04/2020 0950   ALT 10 02/04/2020 0950   ALKPHOS 98 02/04/2020 0950   BILITOT 0.2 02/04/2020 0950   GFRNONAA 136 02/04/2020 0950   GFRAA 157 02/04/2020 0950       Component Value Date/Time   WBC 6.3 03/08/2020 0956   WBC 5.8 10/31/2019 1020   RBC 3.88 03/08/2020 0956   RBC 4.12 10/31/2019 1020   HGB 12.1 03/08/2020 0956   HCT 35.8 03/08/2020 0956   PLT 279 03/08/2020 0956   MCV 92 03/08/2020 0956   MCH 31.2 03/08/2020 0956   MCH 30.6  07/20/2014 1138   MCHC 33.8 03/08/2020 0956   MCHC 34.0 10/31/2019 1020   RDW 13.2 03/08/2020 0956   LYMPHSABS 1.7 10/31/2019 1020   MONOABS 0.4 10/31/2019 1020   EOSABS 0.3 10/31/2019 1020   BASOSABS 0.0 10/31/2019 1020    No results found for: "POCLITH", "LITHIUM"   No results found for: "PHENYTOIN", "PHENOBARB", "VALPROATE", "CBMZ"   .res Assessment: Plan:    Plan:  Continue therapy - sees therapist weekly  Increase Zoloft 138m to 1565mdaily  Time spent with patient was 25 minutes. Greater than 50% of face to face time with patient was  spent on counseling and coordination of care.    RTC 4/6 weeks  Patient advised to contact office with any questions, adverse effects, or acute worsening in signs and symptoms.  Diagnoses and all orders for this visit:  Borderline personality disorder ([place]) -     sertraline (ZOLOFT) 100 MG tablet; Take 1 tablet (100 mg total) by mouth daily.  Body dysmorphic disorder -     sertraline (ZOLOFT) 100 MG tablet; Take 1 tablet (100 mg total) by mouth daily.  Depression, recurrent ([place]) -     sertraline (ZOLOFT) 100 MG tablet; Take 1 tablet (100 mg total) by mouth daily.  GAD (generalized anxiety disorder) -     sertraline (ZOLOFT) 100 MG tablet; Take 1 tablet (100 mg total) by mouth daily.  Obsessive-compulsive disorder, unspecified type -     sertraline (ZOLOFT) 100 MG tablet; Take 1 tablet (100 mg total) by mouth daily.     Please see After Visit Summary for patient specific instructions.  No future appointments.   No orders of the defined types were placed in this encounter.   -------------------------------

## 2022-12-28 ENCOUNTER — Encounter: Payer: Self-pay | Admitting: Adult Health

## 2022-12-28 ENCOUNTER — Ambulatory Visit: Payer: BC Managed Care – PPO | Admitting: Adult Health

## 2022-12-28 DIAGNOSIS — F411 Generalized anxiety disorder: Secondary | ICD-10-CM

## 2022-12-28 DIAGNOSIS — F603 Borderline personality disorder: Secondary | ICD-10-CM

## 2022-12-28 DIAGNOSIS — F429 Obsessive-compulsive disorder, unspecified: Secondary | ICD-10-CM

## 2022-12-28 DIAGNOSIS — F339 Major depressive disorder, recurrent, unspecified: Secondary | ICD-10-CM | POA: Diagnosis not present

## 2022-12-28 DIAGNOSIS — F4522 Body dysmorphic disorder: Secondary | ICD-10-CM

## 2022-12-28 NOTE — Progress Notes (Signed)
Anna Pineda [id number] 13-Dec-2000 22 y.o.  Subjective:   Patient ID:  Anna Pineda is a 22 y.o. (DOB [birth year]) adult.  Chief Complaint: No chief complaint on file.   HPI Anna Pineda presents to the office today for follow-up of GAD, MDD, BPD, OCD, Body dysmorphia.  Describes mood today as "ok". Pleasant. Tearful at times - "weepy". Mood symptoms - reports increased anxiety - worse than it has been - taking longer to calm down. Reports irritability - "a bunch of little things". Denies outbursts. Denies depression. Reports decreased worry, over thinking and rumination. Reports decreased obsessional thoughts and acts - physical and verbal. Mood has been "up and down". Has increased Zoloft to 150mg  daily - uncertain if helpful. Reports a recent IUD placement and feels like she may be experiencing some mood issues related to hormonal changes. Meeting with therapist regularly. Stable interest and motivation. Taking medications as prescribed. Energy levels improved. Active, has a regular exercise routine - going to the gym. Enjoys some usual interests and activities. Single - dating. Lives with roommate - cat, dog and Lizard. Spending time with family - mother and 58 year old brother live locally.   Appetite adequate. Weight stable - 195 pounds.   Sleeps well most nights. Averages 6 to 8 hours. Focus and concentration stable. Completing tasks. Managing aspects of household. 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 Planning to attend graduate school in [place] or [place]. Denies SI or HI.  Denies AH or VH. Denies any recent self harm. Vaping periodically, occasional alcohol, reports daily THC use.  Previous medication trials: Minipress, Effexor  GAD-7    Flowsheet Row [facility] Visit from 02/04/2020 in [facility] at [facility] Visit from 09/25/2019 in [facility] at [facility] Visit from 07/08/2019 in [facility] Primary Care  at [facility]  Total GAD-7 Score 14 17 21       PHQ2-9    Flowsheet Row Office Visit from 02/04/2020 in [facility] at [facility] Visit from 09/25/2019 [facility] Visit from 07/08/2019 in [facility] at [facility] Total Score 2 4 4   PHQ-9 Total Score 11 15 17       Flowsheet Row ED [facility]/08/2022 in [facility] ED from 11/23/2021 in [facility] Department at [facility] Admission (Discharged) from 02/24/2021 in [facility]  C-SSRS RISK CATEGORY No Risk No Risk No Risk        Review of Systems:  Review of Systems  Musculoskeletal:  Negative for gait problem.  Neurological:  Negative for tremors.  Psychiatric/Behavioral:         Please refer to HPI    Medications: I have reviewed the patient's current medications.  Current Outpatient Medications  Medication Sig Dispense Refill   magic mouthwash (nystatin, lidocaine, diphenhydrAMINE) suspension Take 5 mLs by mouth 4 (four) times daily as needed for mouth pain. Swish and spit 180 mL 0   naproxen (NAPROSYN) 500 MG tablet Take 1 tablet (500 mg total) by mouth 2 (two) times daily with a meal. 30 tablet 0   sertraline (ZOLOFT) 100 MG tablet Take 1 tablet (100 mg total) by mouth daily. 135 tablet 3   No current facility-administered medications for this visit.    Medication Side Effects: None  Allergies:  Allergies  Allergen Reactions  Lactase-Lactobacillus Diarrhea and Nausea And Vomiting   Peanut-Containing Drug Products Hives   Latex Rash    Past Medical History:  Diagnosis Date   Anemia    Phreesia 02/04/2020   Anxiety    Phreesia 02/04/2020   Asthma    Phreesia 02/04/2020   Depression    Phreesia 02/04/2020   Depression, recurrent (HCC) 07/08/2019   Food allergy, peanut    GAD (generalized anxiety disorder) 07/08/2019   Mild asthma     Past Medical History, Surgical  history, Social history, and Family history were reviewed and updated as appropriate.   Please see review of systems for further details on the patient's review from today.   Objective:   Physical Exam:  There were no vitals taken for this visit.  Physical Exam Constitutional:      General: Anna Pineda "Dice" is not in acute distress. Musculoskeletal:        General: No deformity.  Neurological:     Mental Status: Anna Pineda "Dice" is alert and oriented to person, place, and time.     Coordination: Coordination normal.  Psychiatric:        Attention and Perception: Attention and perception normal. Anna Pineda "Dice" does not perceive auditory or visual hallucinations.        Mood and Affect: Mood normal. Mood is not anxious or depressed. Affect is not labile, blunt, angry or inappropriate.        Speech: Speech normal.        Behavior: Behavior normal.        Thought Content: Thought content normal. Thought content is not paranoid or delusional. Thought content does not include homicidal or suicidal ideation. Thought content does not include homicidal or suicidal plan.        Cognition and Memory: Cognition and memory normal.        Judgment: Judgment normal.     Comments: Insight intact     Lab Review:     Component Value Date/Time   NA 140 02/04/2020 0950   K 4.2 02/04/2020 0950   CL 105 02/04/2020 0950   CO2 21 02/04/2020 0950   GLUCOSE 92 02/04/2020 0950   GLUCOSE 103 (H) 10/31/2019 1020   BUN 8 02/04/2020 0950   CREATININE 0.57 02/04/2020 0950   CALCIUM 9.1 02/04/2020 0950   PROT 6.1 02/04/2020 0950   ALBUMIN 3.9 02/04/2020 0950   AST 11 02/04/2020 0950   ALT 10 02/04/2020 0950   ALKPHOS 98 02/04/2020 0950   BILITOT 0.2 02/04/2020 0950   GFRNONAA 136 02/04/2020 0950   GFRAA 157 02/04/2020 0950       Component Value Date/Time   WBC 6.3 03/08/2020 0956   WBC 5.8 10/31/2019 1020   RBC 3.88 03/08/2020 0956   RBC 4.12 10/31/2019 1020   HGB 12.1 03/08/2020  0956   HCT 35.8 03/08/2020 0956   PLT 279 03/08/2020 0956   MCV 92 03/08/2020 0956   MCH 31.2 03/08/2020 0956   MCH 30.6 07/20/2014 1138   MCHC 33.8 03/08/2020 0956   MCHC 34.0 10/31/2019 1020   RDW 13.2 03/08/2020 0956   LYMPHSABS 1.7 10/31/2019 1020   MONOABS 0.4 10/31/2019 1020   EOSABS 0.3 10/31/2019 1020   BASOSABS 0.0 10/31/2019 1020    No results found for: "POCLITH", "LITHIUM"   No results found for: "PHENYTOIN", "PHENOBARB", "VALPROATE", "CBMZ"   .res Assessment: Plan:    Plan:  Continue therapy - sees therapist weekly  Increase Zoloft 100mg  to  150mg  daily  Time spent with patient was 25 minutes. Greater than 50% of face to face time with patient was spent on counseling and coordination of care.    RTC 3 months  Patient advised to contact office with any questions, adverse effects, or acute worsening in signs and symptoms.  There are no diagnoses linked to this encounter.   Please see After Visit Summary for patient specific instructions.  No future appointments.  No orders of the defined types were placed in this encounter.   -------------------------------

## 2023-03-26 ENCOUNTER — Ambulatory Visit (INDEPENDENT_AMBULATORY_CARE_PROVIDER_SITE_OTHER): Payer: Self-pay | Admitting: Adult Health

## 2023-03-26 DIAGNOSIS — Z0389 Encounter for observation for other suspected diseases and conditions ruled out: Secondary | ICD-10-CM

## 2023-03-26 NOTE — Progress Notes (Signed)
Patient no show appointment. ? ?

## 2024-01-03 ENCOUNTER — Other Ambulatory Visit: Payer: Self-pay | Admitting: Adult Health

## 2024-01-03 DIAGNOSIS — F603 Borderline personality disorder: Secondary | ICD-10-CM

## 2024-01-03 DIAGNOSIS — F411 Generalized anxiety disorder: Secondary | ICD-10-CM

## 2024-01-03 DIAGNOSIS — F4522 Body dysmorphic disorder: Secondary | ICD-10-CM

## 2024-01-03 DIAGNOSIS — F429 Obsessive-compulsive disorder, unspecified: Secondary | ICD-10-CM

## 2024-01-03 DIAGNOSIS — F339 Major depressive disorder, recurrent, unspecified: Secondary | ICD-10-CM

## 2024-01-04 NOTE — Telephone Encounter (Signed)
 Appt June 2nd

## 2024-01-14 ENCOUNTER — Telehealth (INDEPENDENT_AMBULATORY_CARE_PROVIDER_SITE_OTHER): Payer: Self-pay | Admitting: Adult Health

## 2024-01-14 ENCOUNTER — Encounter: Payer: Self-pay | Admitting: Adult Health

## 2024-01-14 DIAGNOSIS — F411 Generalized anxiety disorder: Secondary | ICD-10-CM | POA: Diagnosis not present

## 2024-01-14 DIAGNOSIS — F429 Obsessive-compulsive disorder, unspecified: Secondary | ICD-10-CM

## 2024-01-14 DIAGNOSIS — F603 Borderline personality disorder: Secondary | ICD-10-CM

## 2024-01-14 DIAGNOSIS — F339 Major depressive disorder, recurrent, unspecified: Secondary | ICD-10-CM | POA: Diagnosis not present

## 2024-01-14 DIAGNOSIS — F4522 Body dysmorphic disorder: Secondary | ICD-10-CM

## 2024-01-14 MED ORDER — SERTRALINE HCL 100 MG PO TABS: ORAL_TABLET | ORAL | 2 refills | Status: DC

## 2024-01-14 NOTE — Progress Notes (Signed)
 Anna Pineda [id number] January 24, 2001 23 y.o.  Virtual Visit via Video Note  I connected with pt @ on 01/14/24 at 10:00 AM EDT by a video enabled telemedicine application and verified that I am speaking with the correct person using two identifiers.   I discussed the limitations of evaluation and management by telemedicine and the availability of in person appointments. The patient expressed understanding and agreed to proceed.  I discussed the assessment and treatment plan with the patient. The patient was provided an opportunity to ask questions and all were answered. The patient agreed with the plan and demonstrated an understanding of the instructions.   The patient was advised to call back or seek an in-person evaluation if the symptoms worsen or if the condition fails to improve as anticipated.  I provided 25 minutes of non-face-to-face time during this encounter.  The patient was located at home.  The provider was located at [facility].   Reagan Camera, NP   Subjective:   Patient ID:  Anna Pineda is a 23 y.o. (DOB 11-25-2000) adult.  Chief Complaint: No chief complaint on file.   HPI Wenonah A Pineda presents for follow-up of GAD, MDD, BPD, OCD, Body dysmorphia.  Describes mood today as "ok". Pleasant. Tearful at times. Mood symptoms - reports depression, anxiety and irritability - "more depressed overall". Reports varying interest and motivation.Denies recent panic attacks. Reports some over thinking.  Denies worry and rumination. Reports more obsessional thoughts and acts - "here lately". Reports mood is variable. Reports taking Zoloft  100mg  daily and would like to increase dose. Reports meeting with therapist regularly. Taking medications as prescribed. Energy levels improved. Active, has a regular exercise routine. Enjoys some usual interests and activities. Single - dating. Lives with family. Appetite adequate. Weight fluctuates - 195 pounds.   Sleeps well most  nights. Averages 6 to 8 hours. Focus and concentration stable. Completing tasks. Managing aspects of household. Full time student at [employer]. Denies SI or HI.  Denies AH or VH. Denies any recent self harm. Reports vaping periodically, occasional alcohol, reports daily THC use.  Previous medication trials: Minipress , Effexor   Review of Systems:  Review of Systems  Musculoskeletal:  Negative for gait problem.  Neurological:  Negative for tremors.  Psychiatric/Behavioral:         Please refer to HPI   Medications: I have reviewed the patient's current medications.  Current Outpatient Medications  Medication Sig Dispense Refill   magic mouthwash (nystatin , lidocaine , diphenhydrAMINE) suspension Take 5 mLs by mouth 4 (four) times daily as needed for mouth pain. Swish and spit 180 mL 0   naproxen  (NAPROSYN ) 500 MG tablet Take 1 tablet (500 mg total) by mouth 2 (two) times daily with a meal. 30 tablet 0   sertraline  (ZOLOFT ) 100 MG tablet Take one and 1/2 tablets daily. 45 tablet 2   No current facility-administered medications for this visit.    Medication Side Effects: None  Allergies:  Allergies  Allergen Reactions   Bacid Diarrhea and Nausea And Vomiting   Peanut-Containing Drug Products Hives   Latex Rash    Past Medical History:  Diagnosis Date   Anemia    Phreesia 02/04/2020   Anxiety    Phreesia 02/04/2020   Asthma    Phreesia 02/04/2020   Depression    Phreesia 02/04/2020   Depression, recurrent (HCC) 07/08/2019   Food allergy, peanut    GAD (generalized anxiety disorder) 07/08/2019   Mild asthma     Family History  Problem Relation Age of Onset   Hypertension Mother    Breast cancer Mother 72   Depression Father    Diabetes Father    Hyperlipidemia Father    Hypertension Father     Social History   Socioeconomic History   Marital status: Single    Spouse name: Not on file   Number of children: Not on file   Years of education: Not on file    Highest education level: Not on file  Occupational History   Occupation: waitress    Employer: [employer]  Tobacco Use   Smoking status: Never    Passive exposure: Yes   Smokeless tobacco: Never  Vaping Use   Vaping status: Never Used  Substance and Sexual Activity   Alcohol use: Not Currently   Drug use: Yes    Types: Marijuana    Comment: denies   Sexual activity: Yes    Birth control/protection: Pill  Other Topics Concern   Not on file  Social History Narrative   Not on file   Social Drivers of Health   Financial Resource Strain: Not on file  Food Insecurity: Not on file  Transportation Needs: Not on file  Physical Activity: Not on file  Stress: Not on file  Social Connections: Not on file  Intimate Partner Violence: Not on file    Past Medical History, Surgical history, Social history, and Family history were reviewed and updated as appropriate.   Please see review of systems for further details on the patient's review from today.   Objective:   Physical Exam:  There were no vitals taken for this visit.  Physical Exam Constitutional:      General: Dice is not in acute distress. Musculoskeletal:        General: No deformity.  Neurological:     Mental Status: Dice is alert and oriented to person, place, and time.     Coordination: Coordination normal.  Psychiatric:        Attention and Perception: Attention and perception normal. Dice does not perceive auditory or visual hallucinations.        Mood and Affect: Mood normal. Mood is not anxious or depressed. Affect is not labile, blunt, angry or inappropriate.        Speech: Speech normal.        Behavior: Behavior normal.        Thought Content: Thought content normal. Thought content is not paranoid or delusional. Thought content does not include homicidal or suicidal ideation. Thought content does not include homicidal or suicidal plan.        Cognition and Memory: Cognition and memory normal.         Judgment: Judgment normal.     Comments: Insight intact     Lab Review:     Component Value Date/Time   NA 140 02/04/2020 0950   K 4.2 02/04/2020 0950   CL 105 02/04/2020 0950   CO2 21 02/04/2020 0950   GLUCOSE 92 02/04/2020 0950   GLUCOSE 103 (H) 10/31/2019 1020   BUN 8 02/04/2020 0950   CREATININE 0.57 02/04/2020 0950   CALCIUM 9.1 02/04/2020 0950   PROT 6.1 02/04/2020 0950   ALBUMIN 3.9 02/04/2020 0950   AST 11 02/04/2020 0950   ALT 10 02/04/2020 0950   ALKPHOS 98 02/04/2020 0950   BILITOT 0.2 02/04/2020 0950   GFRNONAA 136 02/04/2020 0950   GFRAA 157 02/04/2020 0950       Component Value Date/Time   WBC 6.3  03/08/2020 0956   WBC 5.8 10/31/2019 1020   RBC 3.88 03/08/2020 0956   RBC 4.12 10/31/2019 1020   HGB 12.1 03/08/2020 0956   HCT 35.8 03/08/2020 0956   PLT 279 03/08/2020 0956   MCV 92 03/08/2020 0956   MCH 31.2 03/08/2020 0956   MCH 30.6 07/20/2014 1138   MCHC 33.8 03/08/2020 0956   MCHC 34.0 10/31/2019 1020   RDW 13.2 03/08/2020 0956   LYMPHSABS 1.7 10/31/2019 1020   MONOABS 0.4 10/31/2019 1020   EOSABS 0.3 10/31/2019 1020   BASOSABS 0.0 10/31/2019 1020    No results found for: "POCLITH", "LITHIUM"   No results found for: "PHENYTOIN", "PHENOBARB", "VALPROATE", "CBMZ"   .res Assessment: Plan:    Plan:  Continue therapy - sees therapist weekly  Increase Zoloft  100mg  to 150mg  daily  RTC 3 months  25 minutes spent dedicated to the care of this patient on the date of this encounter to include pre-visit review of records, ordering of medication, post visit documentation, and face-to-face time with the patient discussing depression, anxiety and OCD. Discussed continuing current medication regimen.  Patient advised to contact office with any questions, adverse effects, or acute worsening in signs and symptoms.  Diagnoses and all orders for this visit:  Depression, recurrent (HCC) -     sertraline  (ZOLOFT ) 100 MG tablet; Take one and 1/2 tablets  daily.  GAD (generalized anxiety disorder) -     sertraline  (ZOLOFT ) 100 MG tablet; Take one and 1/2 tablets daily.  Borderline personality disorder (HCC) -     sertraline  (ZOLOFT ) 100 MG tablet; Take one and 1/2 tablets daily.  Obsessive-compulsive disorder, unspecified type -     sertraline  (ZOLOFT ) 100 MG tablet; Take one and 1/2 tablets daily.  Body dysmorphic disorder -     sertraline  (ZOLOFT ) 100 MG tablet; Take one and 1/2 tablets daily.     Please see After Visit Summary for patient specific instructions.  No future appointments.   No orders of the defined types were placed in this encounter.     -------------------------------

## 2024-02-11 ENCOUNTER — Encounter: Payer: Self-pay | Admitting: Adult Health

## 2024-02-11 ENCOUNTER — Telehealth (INDEPENDENT_AMBULATORY_CARE_PROVIDER_SITE_OTHER): Payer: Self-pay | Admitting: Adult Health

## 2024-02-11 DIAGNOSIS — F339 Major depressive disorder, recurrent, unspecified: Secondary | ICD-10-CM | POA: Diagnosis not present

## 2024-02-11 DIAGNOSIS — F603 Borderline personality disorder: Secondary | ICD-10-CM | POA: Diagnosis not present

## 2024-02-11 DIAGNOSIS — F411 Generalized anxiety disorder: Secondary | ICD-10-CM

## 2024-02-11 DIAGNOSIS — F4522 Body dysmorphic disorder: Secondary | ICD-10-CM

## 2024-02-11 DIAGNOSIS — F429 Obsessive-compulsive disorder, unspecified: Secondary | ICD-10-CM

## 2024-02-11 NOTE — Progress Notes (Signed)
 Anna Pineda [id number] 2001/03/16 23 y.o.  Virtual Visit via Video Note  I connected with pt @ on 02/11/24 at 10:00 AM EDT by a video enabled telemedicine application and verified that I am speaking with the correct person using two identifiers.   I discussed the limitations of evaluation and management by telemedicine and the availability of in person appointments. The patient expressed understanding and agreed to proceed.  I discussed the assessment and treatment plan with the patient. The patient was provided an opportunity to ask questions and all were answered. The patient agreed with the plan and demonstrated an understanding of the instructions.   The patient was advised to call back or seek an in-person evaluation if the symptoms worsen or if the condition fails to improve as anticipated.  I provided 25 minutes of non-face-to-face time during this encounter.  The patient was located at home.  The provider was located at [facility].   Angeline LOISE Sayers, NP   Subjective:   Patient ID:  Anna Pineda is a 23 y.o. (DOB August 02, 2001) adult.  Chief Complaint: No chief complaint on file.   HPI Anna Pineda presents for follow-up of GAD, MDD, BPD, OCD, Body dysmorphia.  Describes mood today as ok. Pleasant. Tearful at times. Mood symptoms - reports decreased depression, anxiety and irritability. Reports improved interest and motivation. Denies recent panic attacks. Reports some over thinking - to a lesser degree. Denies worry and rumination - working on letting things go. Reports decreased obsessional thoughts and acts. Reports mood is improved - extremes are not so far apart. Stating this is the best I've felt so far on the Zoloft . Reports taking Zoloft  150mg  daily and feels like it is helpful. Taking medications as prescribed. Reports meeting with therapist regularly. Energy levels improved. Active, has a regular [place] routine [place]. Enjoys  some usual interests and activities. Single - dating. Lives with family. Appetite adequate. Weight fluctuates - 200 pounds.   Sleeps well most nights. Averages 5 to 7 hours. Focus and concentration stable. Completing tasks. Managing aspects of household. Full time student at [employer]. Denies SI or HI.  Denies AH or VH. Denies any recent self harm. Reports vaping periodically, occasional alcohol, reports periodically uses THC.  Previous medication trials: Minipress , Effexor   Review of Systems:  Review of Systems  Musculoskeletal:  Negative for gait problem.  Neurological:  Negative for tremors.  Psychiatric/Behavioral:         Please refer to HPI   Medications: I have reviewed the patient's current medications.  Current Outpatient Medications  Medication Sig Dispense Refill   magic mouthwash (nystatin , lidocaine , diphenhydrAMINE) suspension Take 5 mLs by mouth 4 (four) times daily as needed for mouth pain. Swish and spit 180 mL 0   naproxen  (NAPROSYN ) 500 MG tablet Take 1 tablet (500 mg total) by mouth 2 (two) times daily with a meal. 30 tablet 0   sertraline  (ZOLOFT ) 100 MG tablet Take one and 1/2 tablets daily. 45 tablet 2   No current facility-administered medications for this visit.    Medication Side Effects: None  Allergies:  Allergies  Allergen Reactions   Bacid Diarrhea and Nausea And Vomiting   Peanut-Containing Drug Products Hives   Latex Rash    Past Medical History:  Diagnosis Date   Anemia    Phreesia 02/04/2020   Anxiety    Phreesia 02/04/2020   Asthma    Phreesia 02/04/2020   Depression    Phreesia 02/04/2020  Depression, recurrent (HCC) 07/08/2019   Food allergy, peanut    GAD (generalized anxiety disorder) 07/08/2019   Mild asthma     Family History  Problem Relation Age of Onset   Hypertension Mother    Breast cancer Mother 11   Depression Father    Diabetes Father    Hyperlipidemia Father    Hypertension Father     Social History    Socioeconomic History   Marital status: Single    Spouse name: Not on file   Number of children: Not on file   Years of education: Not on file   Highest education level: Not on file  Occupational History   Occupation: waitress    Employer: [employer]  Tobacco Use   Smoking status: Never    Passive exposure: Yes   Smokeless tobacco: Never  Vaping Use   Vaping status: Never Used  Substance and Sexual Activity   Alcohol use: Not Currently   Drug use: Yes    Types: Marijuana    Comment: denies   Sexual activity: Yes    Birth control/protection: Pill  Other Topics Concern   Not on file  Social History Narrative   Not on file   Social Drivers of Health   Financial Resource Strain: Not on file  Food Insecurity: Not on file  Transportation Needs: Not on file  Physical Activity: Not on file  Stress: Not on file  Social Connections: Not on file  Intimate Partner Violence: Not on file    Past Medical History, Surgical history, Social history, and Family history were reviewed and updated as appropriate.   Please see review of systems for further details on the patient's review from today.   Objective:   Physical Exam:  There were no vitals taken for this visit.  Physical Exam Constitutional:      General: Dice is not in acute distress.  Musculoskeletal:        General: No deformity.   Neurological:     Mental Status: Dice is alert and oriented to person, place, and time.     Coordination: Coordination normal.   Psychiatric:        Attention and Perception: Attention and perception normal. Dice does not perceive auditory or visual hallucinations.        Mood and Affect: Mood normal. Mood is not anxious or depressed. Affect is not labile, blunt, angry or inappropriate.        Speech: Speech normal.        Behavior: Behavior normal.        Thought Content: Thought content normal. Thought content is not paranoid or delusional. Thought content does not include  homicidal or suicidal ideation. Thought content does not include homicidal or suicidal plan.        Cognition and Memory: Cognition and memory normal.        Judgment: Judgment normal.     Comments: Insight intact     Lab Review:     Component Value Date/Time   NA 140 02/04/2020 0950   K 4.2 02/04/2020 0950   CL 105 02/04/2020 0950   CO2 21 02/04/2020 0950   GLUCOSE 92 02/04/2020 0950   GLUCOSE 103 (H) 10/31/2019 1020   BUN 8 02/04/2020 0950   CREATININE 0.57 02/04/2020 0950   CALCIUM 9.1 02/04/2020 0950   PROT 6.1 02/04/2020 0950   ALBUMIN 3.9 02/04/2020 0950   AST 11 02/04/2020 0950   ALT 10 02/04/2020 0950   ALKPHOS 98 02/04/2020 0950  BILITOT 0.2 02/04/2020 0950   GFRNONAA 136 02/04/2020 0950   GFRAA 157 02/04/2020 0950       Component Value Date/Time   WBC 6.3 03/08/2020 0956   WBC 5.8 10/31/2019 1020   RBC 3.88 03/08/2020 0956   RBC 4.12 10/31/2019 1020   HGB 12.1 03/08/2020 0956   HCT 35.8 03/08/2020 0956   PLT 279 03/08/2020 0956   MCV 92 03/08/2020 0956   MCH 31.2 03/08/2020 0956   MCH 30.6 07/20/2014 1138   MCHC 33.8 03/08/2020 0956   MCHC 34.0 10/31/2019 1020   RDW 13.2 03/08/2020 0956   LYMPHSABS 1.7 10/31/2019 1020   MONOABS 0.4 10/31/2019 1020   EOSABS 0.3 10/31/2019 1020   BASOSABS 0.0 10/31/2019 1020    No results found for: POCLITH, LITHIUM   No results found for: PHENYTOIN, PHENOBARB, VALPROATE, CBMZ   .res Assessment: Plan:    Plan:  Continue therapy - sees therapist weekly - therapist wanted her to discuss mood stabilizers.  Continue Zoloft  150mg  daily  Working with therapist twice a week - journaling  RTC 3 months  25 minutes spent dedicated to the care of this patient on the date of this encounter to include pre-visit review of records, ordering of medication, post visit documentation, and face-to-face time with the patient discussing GAD, MDD, BPD, OCD, Body dysmorphia. Discussed continuing current medication  regimen.  Patient advised to contact office with any questions, adverse effects, or acute worsening in signs and symptoms.  Diagnoses and all orders for this visit:  GAD (generalized anxiety disorder)  Depression, recurrent (HCC)  Borderline personality disorder (HCC)  Obsessive-compulsive disorder, unspecified type  Body dysmorphic disorder     Please see After Visit Summary for patient specific instructions.  No future appointments.   No orders of the defined types were placed in this encounter.     -------------------------------

## 2024-02-15 ENCOUNTER — Other Ambulatory Visit: Payer: Self-pay | Admitting: Adult Health

## 2024-02-15 DIAGNOSIS — F411 Generalized anxiety disorder: Secondary | ICD-10-CM

## 2024-02-15 DIAGNOSIS — F339 Major depressive disorder, recurrent, unspecified: Secondary | ICD-10-CM

## 2024-02-15 DIAGNOSIS — F429 Obsessive-compulsive disorder, unspecified: Secondary | ICD-10-CM

## 2024-02-15 DIAGNOSIS — F603 Borderline personality disorder: Secondary | ICD-10-CM

## 2024-02-15 DIAGNOSIS — F4522 Body dysmorphic disorder: Secondary | ICD-10-CM

## 2024-03-10 ENCOUNTER — Encounter: Payer: Self-pay | Admitting: Adult Health

## 2024-03-10 ENCOUNTER — Telehealth: Admitting: Adult Health

## 2024-03-10 DIAGNOSIS — F339 Major depressive disorder, recurrent, unspecified: Secondary | ICD-10-CM | POA: Diagnosis not present

## 2024-03-10 DIAGNOSIS — F4522 Body dysmorphic disorder: Secondary | ICD-10-CM

## 2024-03-10 DIAGNOSIS — F429 Obsessive-compulsive disorder, unspecified: Secondary | ICD-10-CM

## 2024-03-10 DIAGNOSIS — F411 Generalized anxiety disorder: Secondary | ICD-10-CM

## 2024-03-10 DIAGNOSIS — F603 Borderline personality disorder: Secondary | ICD-10-CM

## 2024-03-10 MED ORDER — SERTRALINE HCL 100 MG PO TABS: ORAL_TABLET | ORAL | 2 refills | Status: DC

## 2024-03-10 NOTE — Progress Notes (Signed)
 Anna Pineda [id number] 2001/04/02 23 y.o.  Virtual Visit via Video Note  I connected with pt @ on 03/10/24 at 11:00 AM EDT by a video enabled telemedicine application and verified that I am speaking with the correct person using two identifiers.   I discussed the limitations of evaluation and management by telemedicine and the availability of in person appointments. The patient expressed understanding and agreed to proceed.  I discussed the assessment and treatment plan with the patient. The patient was provided an opportunity to ask questions and all were answered. The patient agreed with the plan and demonstrated an understanding of the instructions.   The patient was advised to call back or seek an in-person evaluation if the symptoms worsen or if the condition fails to improve as anticipated.  I provided 25 minutes of non-face-to-face time during this encounter.  The patient was located at home.  The provider was located at [facility].   Angeline LOISE Sayers, NP   Subjective:   Patient ID:  Anna Pineda is a 23 y.o. (DOB 2000-10-02) adult.  Chief Complaint: No chief complaint on file.   HPI Emya A Pineda presents for follow-up of GAD, MDD, BPD, OCD, Body dysmorphia.  Describes mood today as ok. Pleasant. Tearful at times. Mood symptoms - reports some situational depression, anxiety and irritability. Reports improved interest and motivation. Denies recent panic attacks. Reports some over thinking, worry and rumination - surrounding father. Reports decreased obsessional thoughts and acts - routine of cleaning. Reports mood is variable with recent circumstances. Stating I feel like it's easier for me to find happiness in the smaller things. Reports the Zoloft  150mg  daily  is helpful for mood symptoms. Taking medications as prescribed. Reports meeting with therapist regularly. Energy levels improved. Active, has a regular exercise routine. Enjoys some usual interests  and activities. Single - dating. Lives with family. Appetite adequate. Weight fluctuates - 200 pounds.   Sleeps well most nights. Averages 5 to 7 hours. Denies daytime napping. Focus and concentration stable. Completing tasks. Managing aspects of household. Full time student at [employer]. Denies SI or HI.  Denies AH or VH. Denies any recent self harm. Reports vaping periodically, occasional alcohol, reports periodically uses THC.  Previous medication trials: Minipress , Effexor    Review of Systems:  Review of Systems  Musculoskeletal:  Negative for gait problem.  Neurological:  Negative for tremors.  Psychiatric/Behavioral:         Please refer to HPI    Medications: I have reviewed the patient's current medications.  Current Outpatient Medications  Medication Sig Dispense Refill   magic mouthwash (nystatin , lidocaine , diphenhydrAMINE) suspension Take 5 mLs by mouth 4 (four) times daily as needed for mouth pain. Swish and spit 180 mL 0   naproxen  (NAPROSYN ) 500 MG tablet Take 1 tablet (500 mg total) by mouth 2 (two) times daily with a meal. 30 tablet 0   sertraline  (ZOLOFT ) 100 MG tablet Take one and 1/2 tablets daily. 45 tablet 2   No current facility-administered medications for this visit.    Medication Side Effects: None  Allergies:  Allergies  Allergen Reactions   Bacid Diarrhea and Nausea And Vomiting   Peanut-Containing Drug Products Hives   Latex Rash    Past Medical History:  Diagnosis Date   Anemia    Phreesia 02/04/2020   Anxiety    Phreesia 02/04/2020   Asthma    Phreesia 02/04/2020   Depression    Phreesia 02/04/2020   Depression, recurrent (HCC) 07/08/2019  Food allergy, peanut    GAD (generalized anxiety disorder) 07/08/2019   Mild asthma     Family History  Problem Relation Age of Onset   Hypertension Mother    Breast cancer Mother 43   Depression Father    Diabetes Father    Hyperlipidemia Father    Hypertension Father     Social History    Socioeconomic History   Marital status: Single    Spouse name: Not on file   Number of children: Not on file   Years of education: Not on file   Highest education level: Not on file  Occupational History   Occupation: waitress    Employer: [employer]  Tobacco Use   Smoking status: Never    Passive exposure: Yes   Smokeless tobacco: Never  Vaping Use   Vaping status: Never Used  Substance and Sexual Activity   Alcohol use: Not Currently   Drug use: Yes    Types: Marijuana    Comment: denies   Sexual activity: Yes    Birth control/protection: Pill  Other Topics Concern   Not on file  Social History Narrative   Not on file   Social Drivers of Health   Financial Resource Strain: Not on file  Food Insecurity: Not on file  Transportation Needs: Not on file  Physical Activity: Not on file  Stress: Not on file  Social Connections: Not on file  Intimate Partner Violence: Not on file    Past Medical History, Surgical history, Social history, and Family history were reviewed and updated as appropriate.   Please see review of systems for further details on the patient's review from today.   Objective:   Physical Exam:  There were no vitals taken for this visit.  Physical Exam Constitutional:      General: Dice is not in acute distress. Musculoskeletal:        General: No deformity.  Neurological:     Mental Status: Dice is alert and oriented to person, place, and time.     Coordination: Coordination normal.  Psychiatric:        Attention and Perception: Attention and perception normal. Dice does not perceive auditory or visual hallucinations.        Mood and Affect: Mood normal. Mood is not anxious or depressed. Affect is not labile, blunt, angry or inappropriate.        Speech: Speech normal.        Behavior: Behavior normal.        Thought Content: Thought content normal. Thought content is not paranoid or delusional. Thought content does not include homicidal  or suicidal ideation. Thought content does not include homicidal or suicidal plan.        Cognition and Memory: Cognition and memory normal.        Judgment: Judgment normal.     Comments: Insight intact     Lab Review:     Component Value Date/Time   NA 140 02/04/2020 0950   K 4.2 02/04/2020 0950   CL 105 02/04/2020 0950   CO2 21 02/04/2020 0950   GLUCOSE 92 02/04/2020 0950   GLUCOSE 103 (H) 10/31/2019 1020   BUN 8 02/04/2020 0950   CREATININE 0.57 02/04/2020 0950   CALCIUM 9.1 02/04/2020 0950   PROT 6.1 02/04/2020 0950   ALBUMIN 3.9 02/04/2020 0950   AST 11 02/04/2020 0950   ALT 10 02/04/2020 0950   ALKPHOS 98 02/04/2020 0950   BILITOT 0.2 02/04/2020 0950   GFRNONAA  136 02/04/2020 0950   GFRAA 157 02/04/2020 0950       Component Value Date/Time   WBC 6.3 03/08/2020 0956   WBC 5.8 10/31/2019 1020   RBC 3.88 03/08/2020 0956   RBC 4.12 10/31/2019 1020   HGB 12.1 03/08/2020 0956   HCT 35.8 03/08/2020 0956   PLT 279 03/08/2020 0956   MCV 92 03/08/2020 0956   MCH 31.2 03/08/2020 0956   MCH 30.6 07/20/2014 1138   MCHC 33.8 03/08/2020 0956   MCHC 34.0 10/31/2019 1020   RDW 13.2 03/08/2020 0956   LYMPHSABS 1.7 10/31/2019 1020   MONOABS 0.4 10/31/2019 1020   EOSABS 0.3 10/31/2019 1020   BASOSABS 0.0 10/31/2019 1020    No results found for: POCLITH, LITHIUM   No results found for: PHENYTOIN, PHENOBARB, VALPROATE, CBMZ   .res Assessment: Plan:    Plan:  Continue therapy - sees therapist weekly - therapist wanted her to discuss mood stabilizers.  Continue Zoloft  150mg  daily  Working with therapist twice a week - journaling.  RTC 3 months  25 minutes spent dedicated to the care of this patient on the date of this encounter to include pre-visit review of records, ordering of medication, post visit documentation, and face-to-face time with the patient discussing GAD, MDD, BPD, OCD, body dysmorphia. Discussed continuing current medication  regimen.  Patient advised to contact office with any questions, adverse effects, or acute worsening in signs and symptoms.  There are no diagnoses linked to this encounter.   Please see After Visit Summary for patient specific instructions.  No future appointments.  No orders of the defined types were placed in this encounter.     -------------------------------

## 2024-06-10 ENCOUNTER — Telehealth: Payer: Self-pay | Admitting: Adult Health

## 2024-06-10 ENCOUNTER — Encounter: Payer: Self-pay | Admitting: Adult Health

## 2024-06-10 DIAGNOSIS — Z0389 Encounter for observation for other suspected diseases and conditions ruled out: Secondary | ICD-10-CM

## 2024-06-10 NOTE — Progress Notes (Signed)
 Patient no show appointment. Called and LVM to call and R/S.

## 2024-06-10 NOTE — Progress Notes (Deleted)
 Anna Pineda [id number] 2001/05/22 23 y.o.  Virtual Visit via Video Note  I connected with pt @ on 06/10/24 at  8:30 AM EDT by a video enabled telemedicine application and verified that I am speaking with the correct person using two identifiers.   I discussed the limitations of evaluation and management by telemedicine and the availability of in person appointments. The patient expressed understanding and agreed to proceed.  I discussed the assessment and treatment plan with the patient. The patient was provided an opportunity to ask questions and all were answered. The patient agreed with the plan and demonstrated an understanding of the instructions.   The patient was advised to call back or seek an in-person evaluation if the symptoms worsen or if the condition fails to improve as anticipated.  I provided *** minutes of non-face-to-face time during this encounter.  The patient was located at home.  The provider was located at [facility].   Angeline LOISE Sayers, NP   Subjective:   Patient ID:  Anna Pineda is a 23 y.o. (DOB May 05, 2001) adult.  Chief Complaint: No chief complaint on file.   HPI Anna Pineda presents for follow-up of GAD, MDD, BPD, OCD, Body dysmorphia.  Describes mood today as ok. Pleasant. Tearful at times. Mood symptoms - reports some situational depression, anxiety and irritability. Reports improved interest and motivation. Denies recent panic attacks. Reports some over thinking, worry and rumination - surrounding father. Reports decreased obsessional thoughts and acts - routine of cleaning. Reports mood is variable with recent circumstances. Stating I feel like it's easier for me to find happiness in the smaller things. Reports the Zoloft  150mg  daily  is helpful for mood symptoms. Taking medications as prescribed. Reports meeting with therapist regularly. Energy levels improved. Active, has a regular exercise routine. Enjoys some usual interests  and activities. Single - dating. Lives with family. Appetite adequate. Weight fluctuates - 200 pounds.   Sleeps well most nights. Averages 5 to 7 hours. Denies daytime napping. Focus and concentration stable. Completing tasks. Managing aspects of household. Full time student at [employer]. Denies SI or HI.  Denies AH or VH. Denies any recent self harm. Reports vaping periodically, occasional alcohol, reports periodically uses THC.  Previous medication trials: Minipress , Effexor    Review of Systems:  Review of Systems  Musculoskeletal:  Negative for gait problem.  Neurological:  Negative for tremors.  Psychiatric/Behavioral:         Please refer to HPI    Medications: {medication reviewed/display:3041432}  Current Outpatient Medications  Medication Sig Dispense Refill   magic mouthwash (nystatin , lidocaine , diphenhydrAMINE) suspension Take 5 mLs by mouth 4 (four) times daily as needed for mouth pain. Swish and spit 180 mL 0   naproxen  (NAPROSYN ) 500 MG tablet Take 1 tablet (500 mg total) by mouth 2 (two) times daily with a meal. 30 tablet 0   sertraline  (ZOLOFT ) 100 MG tablet Take one and 1/2 tablets daily. 45 tablet 2   No current facility-administered medications for this visit.    Medication Side Effects: {Medication Side Effects (Optional):21014029}  Allergies:  Allergies  Allergen Reactions   Bacid Diarrhea and Nausea And Vomiting   Peanut-Containing Drug Products Hives   Latex Rash    Past Medical History:  Diagnosis Date   Anemia    Phreesia 02/04/2020   Anxiety    Phreesia 02/04/2020   Asthma    Phreesia 02/04/2020   Depression    Phreesia 02/04/2020   Depression, recurrent 07/08/2019  Food allergy, peanut    GAD (generalized anxiety disorder) 07/08/2019   Mild asthma     Family History  Problem Relation Age of Onset   Hypertension Mother    Breast cancer Mother 13   Depression Father    Diabetes Father    Hyperlipidemia Father    Hypertension Father      Social History   Socioeconomic History   Marital status: Single    Spouse name: Not on file   Number of children: Not on file   Years of education: Not on file   Highest education level: Not on file  Occupational History   Occupation: waitress    Employer: [employer]  Tobacco Use   Smoking status: Never    Passive exposure: Yes   Smokeless tobacco: Never  Vaping Use   Vaping status: Never Used  Substance and Sexual Activity   Alcohol use: Not Currently   Drug use: Yes    Types: Marijuana    Comment: denies   Sexual activity: Yes    Birth control/protection: Pill  Other Topics Concern   Not on file  Social History Narrative   Not on file   Social Drivers of Health   Financial Resource Strain: High Risk (05/21/2024)   Received from [facility]   Overall Financial Resource Strain (CARDIA)    Difficulty of Paying Living Expenses: Hard  Food Insecurity: Food Insecurity Present (05/21/2024)   Received from [facility]   Hunger Vital Sign    Within the past 12 months, you worried that your food would run out before you got the money to buy more.: Sometimes true    Within the past 12 months, the food you bought just didn't last and you didn't have money to get more.: Sometimes true  Transportation Needs: Unmet Transportation Needs (05/21/2024)   Received from [facility]    In the past 12 months, has lack of transportation kept you from medical appointments or from getting medications?: Yes    Lack of Transportation (Non-Medical): Yes  Physical Activity: Not on file  Stress: Not on file  Social Connections: Not on file  Intimate Partner Violence: Not on file    Past Medical History, Surgical history, Social history, and Family history were reviewed and updated as appropriate.   Please see review of systems for further details on the patient's review from today.   Objective:    Physical Exam:  There were no vitals taken for this visit.  Physical Exam Constitutional:      General: Dice is not in acute distress. Musculoskeletal:        General: No deformity.  Neurological:     Mental Status: Dice is alert and oriented to person, place, and time.     Coordination: Coordination normal.  Psychiatric:        Attention and Perception: Attention and perception normal. Dice does not perceive auditory or visual hallucinations.        Mood and Affect: Mood normal. Mood is not anxious or depressed. Affect is not labile, blunt, angry or inappropriate.        Speech: Speech normal.        Behavior: Behavior normal.        Thought Content: Thought content normal. Thought content is not paranoid or delusional. Thought content does not include homicidal or suicidal ideation. Thought content does not include homicidal or suicidal plan.  Cognition and Memory: Cognition and memory normal.        Judgment: Judgment normal.     Comments: Insight intact     Lab Review:     Component Value Date/Time   NA 140 02/04/2020 0950   K 4.2 02/04/2020 0950   CL 105 02/04/2020 0950   CO2 21 02/04/2020 0950   GLUCOSE 92 02/04/2020 0950   GLUCOSE 103 (H) 10/31/2019 1020   BUN 8 02/04/2020 0950   CREATININE 0.57 02/04/2020 0950   CALCIUM 9.1 02/04/2020 0950   PROT 6.1 02/04/2020 0950   ALBUMIN 3.9 02/04/2020 0950   AST 11 02/04/2020 0950   ALT 10 02/04/2020 0950   ALKPHOS 98 02/04/2020 0950   BILITOT 0.2 02/04/2020 0950   GFRNONAA 136 02/04/2020 0950   GFRAA 157 02/04/2020 0950       Component Value Date/Time   WBC 6.3 03/08/2020 0956   WBC 5.8 10/31/2019 1020   RBC 3.88 03/08/2020 0956   RBC 4.12 10/31/2019 1020   HGB 12.1 03/08/2020 0956   HCT 35.8 03/08/2020 0956   PLT 279 03/08/2020 0956   MCV 92 03/08/2020 0956   MCH 31.2 03/08/2020 0956   MCH 30.6 07/20/2014 1138   MCHC 33.8 03/08/2020 0956   MCHC 34.0 10/31/2019 1020   RDW 13.2 03/08/2020 0956    LYMPHSABS 1.7 10/31/2019 1020   MONOABS 0.4 10/31/2019 1020   EOSABS 0.3 10/31/2019 1020   BASOSABS 0.0 10/31/2019 1020    No results found for: POCLITH, LITHIUM   No results found for: PHENYTOIN, PHENOBARB, VALPROATE, CBMZ   .res Assessment: Plan:    Plan:  Continue therapy - sees therapist weekly - therapist wanted her to discuss mood stabilizers.  Continue Zoloft  150mg  daily  Working with therapist twice a week - journaling.  RTC 3 months  25 minutes spent dedicated to the care of this patient on the date of this encounter to include pre-visit review of records, ordering of medication, post visit documentation, and face-to-face time with the patient discussing GAD, MDD, BPD, OCD, body dysmorphia. Discussed continuing current medication regimen.  Patient advised to contact office with any questions, adverse effects, or acute worsening in signs and symptoms.  There are no diagnoses linked to this encounter.   Please see After Visit Summary for patient specific instructions.  Future Appointments  Date Time Provider Department Center  06/10/2024  8:30 AM Wilkin Lippy Nattalie, NP [facility] None    No orders of the defined types were placed in this encounter.     -------------------------------

## 2024-07-22 ENCOUNTER — Other Ambulatory Visit: Payer: Self-pay | Admitting: Adult Health

## 2024-07-22 DIAGNOSIS — F603 Borderline personality disorder: Secondary | ICD-10-CM

## 2024-07-22 DIAGNOSIS — F429 Obsessive-compulsive disorder, unspecified: Secondary | ICD-10-CM

## 2024-07-22 DIAGNOSIS — F411 Generalized anxiety disorder: Secondary | ICD-10-CM

## 2024-07-22 DIAGNOSIS — F4522 Body dysmorphic disorder: Secondary | ICD-10-CM

## 2024-07-22 DIAGNOSIS — F339 Major depressive disorder, recurrent, unspecified: Secondary | ICD-10-CM

## 2024-07-23 NOTE — Telephone Encounter (Signed)
 Please call pt to schedule appt, she is due for one

## 2024-07-25 NOTE — Telephone Encounter (Signed)
 She reports still taking sertraline .

## 2024-07-25 NOTE — Telephone Encounter (Signed)
 Pt said she LM to schedule and hasn't heard back yet. Please call again.

## 2024-07-25 NOTE — Telephone Encounter (Signed)
Is patient taking?
